# Patient Record
Sex: Male | Born: 1965 | Race: White | Hispanic: No | Marital: Married | Smoking: Former smoker
Health system: Southern US, Community
[De-identification: ages and names within clinical notes are randomized; demographics above are authoritative.]

## PROBLEM LIST (undated history)

## (undated) DIAGNOSIS — M519 Unspecified thoracic, thoracolumbar and lumbosacral intervertebral disc disorder: Secondary | ICD-10-CM

## (undated) DIAGNOSIS — G8929 Other chronic pain: Secondary | ICD-10-CM

## (undated) DIAGNOSIS — F1910 Other psychoactive substance abuse, uncomplicated: Secondary | ICD-10-CM

## (undated) DIAGNOSIS — I10 Essential (primary) hypertension: Secondary | ICD-10-CM

## (undated) DIAGNOSIS — F419 Anxiety disorder, unspecified: Secondary | ICD-10-CM

## (undated) DIAGNOSIS — F1010 Alcohol abuse, uncomplicated: Secondary | ICD-10-CM

## (undated) DIAGNOSIS — D229 Melanocytic nevi, unspecified: Secondary | ICD-10-CM

## (undated) DIAGNOSIS — L409 Psoriasis, unspecified: Secondary | ICD-10-CM

## (undated) HISTORY — DX: Other chronic pain: G89.29

## (undated) HISTORY — DX: Anxiety disorder, unspecified: F41.9

## (undated) HISTORY — PX: SHOULDER SURGERY: SHX246

## (undated) HISTORY — PX: APPENDECTOMY: SHX54

## (undated) HISTORY — DX: Essential (primary) hypertension: I10

## (undated) HISTORY — DX: Unspecified thoracic, thoracolumbar and lumbosacral intervertebral disc disorder: M51.9

## (undated) HISTORY — DX: Psoriasis, unspecified: L40.9

## (undated) HISTORY — DX: Melanocytic nevi, unspecified: D22.9

---

## 2001-05-19 DIAGNOSIS — L409 Psoriasis, unspecified: Secondary | ICD-10-CM

## 2001-05-19 HISTORY — DX: Psoriasis, unspecified: L40.9

## 2010-06-09 ENCOUNTER — Encounter: Payer: Self-pay | Admitting: Internal Medicine

## 2014-11-23 ENCOUNTER — Ambulatory Visit (INDEPENDENT_AMBULATORY_CARE_PROVIDER_SITE_OTHER): Payer: BLUE CROSS/BLUE SHIELD | Admitting: Physician Assistant

## 2014-11-23 ENCOUNTER — Ambulatory Visit (INDEPENDENT_AMBULATORY_CARE_PROVIDER_SITE_OTHER): Payer: BLUE CROSS/BLUE SHIELD

## 2014-11-23 VITALS — BP 140/92 | HR 99 | Temp 98.9°F | Resp 16 | Ht 71.5 in | Wt 201.6 lb

## 2014-11-23 DIAGNOSIS — M25572 Pain in left ankle and joints of left foot: Secondary | ICD-10-CM

## 2014-11-23 LAB — CBC
HEMATOCRIT: 53.2 % — AB (ref 39.0–52.0)
HEMOGLOBIN: 19 g/dL — AB (ref 13.0–17.0)
MCH: 33 pg (ref 26.0–34.0)
MCHC: 35.7 g/dL (ref 30.0–36.0)
MCV: 92.4 fL (ref 78.0–100.0)
MPV: 10.5 fL (ref 8.6–12.4)
Platelets: 228 10*3/uL (ref 150–400)
RBC: 5.76 MIL/uL (ref 4.22–5.81)
RDW: 13.3 % (ref 11.5–15.5)
WBC: 8.4 10*3/uL (ref 4.0–10.5)

## 2014-11-23 LAB — BASIC METABOLIC PANEL
BUN: 12 mg/dL (ref 6–23)
CHLORIDE: 100 meq/L (ref 96–112)
CO2: 24 mEq/L (ref 19–32)
Calcium: 9.6 mg/dL (ref 8.4–10.5)
Creat: 0.89 mg/dL (ref 0.50–1.35)
Glucose, Bld: 93 mg/dL (ref 70–99)
POTASSIUM: 4.2 meq/L (ref 3.5–5.3)
SODIUM: 139 meq/L (ref 135–145)

## 2014-11-23 LAB — URIC ACID: Uric Acid, Serum: 8.5 mg/dL — ABNORMAL HIGH (ref 4.0–7.8)

## 2014-11-23 MED ORDER — MELOXICAM 7.5 MG PO TABS: 7.5000 mg | ORAL_TABLET | Freq: Every day | ORAL | Status: DC

## 2014-11-23 NOTE — Patient Instructions (Addendum)
Your xrays were normal today.  We are checking labs to look for infection and gout. I'll let you know the results of these.  You likely have a calcaneal bursitis causing your symptoms.  Please take the meloxicam once daily for the next 3-4 weeks.  Please avoid shoes that are bothersome, ice the area after activity, and rest and elevate as often as possible.  If you're not improving in one week please let us know.   Bursitis Bursitis is a swelling and soreness (inflammation) of a fluid-filled sac (bursa) that overlies and protects a joint. It can be caused by injury, overuse of the joint, arthritis or infection. The joints most likely to be affected are the elbows, shoulders, hips and knees. HOME CARE INSTRUCTIONS   Apply ice to the affected area for 15-20 minutes each hour while awake for 2 days. Put the ice in a plastic bag and place a towel between the bag of ice and your skin.  Rest the injured joint as much as possible, but continue to put the joint through a full range of motion, 4 times per day. (The shoulder joint especially becomes rapidly "frozen" if not used.) When the pain lessens, begin normal slow movements and usual activities.  Only take over-the-counter or prescription medicines for pain, discomfort or fever as directed by your caregiver.  Your caregiver may recommend draining the bursa and injecting medicine into the bursa. This may help the healing process.  Follow all instructions for follow-up with your caregiver. This includes any orthopedic referrals, physical therapy and rehabilitation. Any delay in obtaining necessary care could result in a delay or failure of the bursitis to heal and chronic pain. SEEK IMMEDIATE MEDICAL CARE IF:   Your pain increases even during treatment.  You develop an oral temperature above 102 F (38.9 C) and have heat and inflammation over the involved bursa. MAKE SURE YOU:   Understand these instructions.  Will watch your  condition.  Will get help right away if you are not doing well or get worse. Document Released: 05/02/2000 Document Revised: 07/28/2011 Document Reviewed: 07/25/2013 [facility] Patient Information 2015 [place], [place]. This information is not intended to replace advice given to you by your health care provider. Make sure you discuss any questions you have with your health care provider.

## 2014-11-23 NOTE — Progress Notes (Signed)
Subjective:    Patient ID: Juan [name], male    DOB: 19-May-1966, 49 y.o.   MRN: [id number]  Chief Complaint  Patient presents with  . Ankle Injury    swollen left ankle x 1 week, no none injury to it   There are no active problems to display for this patient.  Prior to Admission medications   Medication Sig Start Date End Date Taking? Authorizing Provider  ALPRAZolam Duanne Moron) 0.5 MG tablet Take 0.5 mg by mouth at bedtime as needed for anxiety.   Yes Historical Provider, MD  amLODipine (NORVASC) 10 MG tablet Take 10 mg by mouth daily.   Yes Historical Provider, MD  HYDROcodone-acetaminophen (NORCO) 7.5-325 MG per tablet Take 1 tablet by mouth every 6 (six) hours as needed for moderate pain.   Yes Historical Provider, MD  lisinopril (PRINIVIL,ZESTRIL) 40 MG tablet Take 40 mg by mouth daily.   Yes Historical Provider, MD  metoprolol succinate (TOPROL-XL) 25 MG 24 hr tablet Take 25 mg by mouth daily.   Yes Historical Provider, MD  meloxicam (MOBIC) 7.5 MG tablet Take 1 tablet (7.5 mg total) by mouth daily. 11/23/14   Araceli Bouche, PA   Medications, allergies, past medical history, surgical history, family history, social history and problem list reviewed and updated.  HPI  33 yom presents with left ankle/achilles pain.   Sx started one wk ago. Was walking dog noticed mild pain medial ankle, achilles. No trauma. Did not twist ankle. No "pop." Has been able to walk and wear shoes but ongoing mild pain in area past week.   Denies fevers, chills.   Review of Systems See HPI.     Objective:   Physical Exam  Constitutional: He is oriented to person, place, and time. He appears well-developed and well-nourished.  Non-toxic appearance. He does not have a sickly appearance. He does not appear ill. No distress.  BP 140/92 mmHg  Pulse 99  Temp(Src) 98.9 F (37.2 C) (Oral)  Resp 16  Ht 5' 11.5" (1.816 m)  Wt 201 lb 9.6 oz (91.445 kg)  BMI 27.73 kg/m2  SpO2 98%   Musculoskeletal:     Left ankle: He exhibits swelling. Tenderness. Medial malleolus tenderness found. No AITFL, no CF ligament, no posterior TFL, no head of 5th metatarsal and no proximal fibula tenderness found. Achilles tendon exhibits pain. Achilles tendon exhibits no defect and normal Thompson's test results.       Left lower leg: Normal. He exhibits no tenderness, no bony tenderness and no swelling.       Feet:  Mild swelling, warmth over circled area. Moderate ttp over circled area. No decreased rom. No laxity with joint testing. Normal cap refill. Normal sensation. Negative thompsons test. TTP at base of achilles attachment and over medial calcaneous.   Neurological: He is alert and oriented to person, place, and time.   [facility] reading (PRIMARY) by  Dr. Marin Comment. Left ankle/left calcaneal findings: Normal     Assessment & Plan:   Left ankle pain - Plan: DG Ankle Complete Left, DG Os Calcis Left, Basic metabolic panel, Uric Acid, CBC, CANCELED: POCT CBC --normal xrays --await cbc, uric acid, bmp for possible septic arthritis, cellulitis, gout, though doubt septic arthritis with normal rom, able to bear weight, no fevers, chills --suspect retrocalcaneal bursitis  --mobic qd, continue heel lifts, avoid bothersome shoes, rest, elevate, ice --rtc if no improvement one week  Julieta Gutting, PA-C Physician Assistant-Certified [facility]  11/23/2014 2:32 PM

## 2014-11-27 ENCOUNTER — Telehealth: Payer: Self-pay | Admitting: Physician Assistant

## 2014-11-27 NOTE — Telephone Encounter (Signed)
Left message with pt to discuss elevated uric acid.

## 2014-11-27 NOTE — Telephone Encounter (Signed)
Pt returned phone call. Informed him of elevated uric acid. He states his pain has resolved. Instructed to limit alcohol, meat, seafood, and increase exercise level to try to get some weight loss. He is agreeable to this. Informed him he is at risk of gout attacks in the future with this elevated level. Also informed pt of elevated H/H and that we would like to repeat in 3 months. He states he has a pcp who will follow up on this and that he has been told he has an elevated hemoglobin in the past.

## 2015-02-06 DIAGNOSIS — D229 Melanocytic nevi, unspecified: Secondary | ICD-10-CM

## 2015-02-06 HISTORY — DX: Melanocytic nevi, unspecified: D22.9

## 2015-07-16 ENCOUNTER — Ambulatory Visit (INDEPENDENT_AMBULATORY_CARE_PROVIDER_SITE_OTHER): Payer: BLUE CROSS/BLUE SHIELD | Admitting: Family Medicine

## 2015-07-16 ENCOUNTER — Ambulatory Visit (INDEPENDENT_AMBULATORY_CARE_PROVIDER_SITE_OTHER): Payer: BLUE CROSS/BLUE SHIELD

## 2015-07-16 ENCOUNTER — Encounter: Payer: Self-pay | Admitting: Family Medicine

## 2015-07-16 VITALS — BP 114/86 | HR 86 | Temp 98.3°F | Resp 16 | Ht 70.0 in | Wt 194.0 lb

## 2015-07-16 DIAGNOSIS — S42292A Other displaced fracture of upper end of left humerus, initial encounter for closed fracture: Secondary | ICD-10-CM | POA: Diagnosis not present

## 2015-07-16 DIAGNOSIS — S60811A Abrasion of right wrist, initial encounter: Secondary | ICD-10-CM | POA: Diagnosis not present

## 2015-07-16 DIAGNOSIS — M25512 Pain in left shoulder: Secondary | ICD-10-CM

## 2015-07-16 MED ORDER — HYDROCODONE-ACETAMINOPHEN 10-325 MG PO TABS: 1.0000 | ORAL_TABLET | Freq: Four times a day (QID) | ORAL | Status: DC | PRN

## 2015-07-16 NOTE — Patient Instructions (Addendum)
Because you received an x-ray today, you will receive an invoice from [facility]. Please contact [facility] at [phone] with questions or concerns regarding your invoice. Our billing staff will not be able to assist you with those questions. Shoulder Fracture (Proximal Humerus or Glenoid) A shoulder fracture is a broken upper arm bone or a broken socket bone. The humerus is the upper arm bone and the glenoid is the shoulder socket. Proximal means the humerus is broken near the shoulder. Most of the time the bones of a broken shoulder are in an acceptable position. Usually, the injury can be treated with a shoulder immobilizer or sling and swath bandage. These devices support the arm and prevent any shoulder movement. If the bones are not in a good position, then surgery is sometimes needed. Shoulder fractures usually initially cause swelling, pain, and discoloration around the upper arm. They heal in 8 to 12 weeks with proper treatment. SYMPTOMS  At the time of injury:  Pain.  Tenderness.  Regular body contours are not normal. Later symptoms may include:  Swelling and bruising of the elbow and hand.  Swelling and bruising of the arm or chest. Other symptoms include:  Pain when lifting or turning the arm.  Paralysis below the fracture.  Numbness or coldness below the fracture. CAUSES   Indirect force from falling on an outstretched arm.  A blow to the shoulder. RISK INCREASES WITH:  Not being in shape.  Playing contact sports, such as football, soccer, hockey, or rugby.  Sports where falling on an outstretched arm occurs, such as basketball, skateboarding, or volleyball.  History of bone or joint disease.  History of shoulder injury. PREVENTION  Warm up before activity.  Stretch before activity.  Stay in shape with your:  Heart fitness.  Flexibility.  Shoulder Strength.  Falling with the proper technique. PROGNOSIS  In adults, healing time  is about 7 weeks. For children, healing time is about 5 weeks. Surgery may be needed. RELATED COMPLICATIONS  The bones do not heal together (nonunion).  The bones do not align properly when they heal (malunion).  Long-term problems with pain, stiffness, swelling, or loss of motion.  The injured arm heals shorter than the other.  Nerves are injured in the arm.  Arthritis in the shoulder.  Normal bone growth is interrupted in children.  Blood supply to the shoulder joint is diminished. TREATMENT If the bones are aligned, then initial treatment will be with ice and medicine to help with pain. The shoulder will be held in place with a sling (immobilization). The shoulder will be allowed to heal for up to 6 weeks. Injuries that may need surgery include:  Severe fractures.  Fractures that are not in appropriate alignment (displaced).  Non-displaced fractures (not common). Surgery helps the bones align correctly. The bones may be held in place with:  Sutures.  Wires.  Rods.  Plates.  Screws.  Pins. If you have had surgery or not, you will likely be assisted by a physical therapist or athletic trainer to get the best results with your injured shoulder. This will likely include exercises to strengthen and stretch the injured and surrounding areas. MEDICATION  If pain medicine is needed, nonsteroidal anti-inflammatory medicines (such as aspirin or ibuprofen) or other minor pain relievers (such as acetaminophen) are often advised.  Do not take pain medicine for 7 days before surgery.  Stronger pain relievers may be prescribed. Use only as directed and take only as much as you need. COLD THERAPY  Cold treatment (icing) relieves pain and reduces inflammation. Cold treatment should be applied for 10 to 15 minutes every 2 to 3 hours, and immediately after activity that aggravates your symptoms. Use ice packs or an ice massage. SEEK IMMEDIATE MEDICAL CARE IF:  You have severe  shoulder pain unrelieved by rest and taking pain medicine.  You have pain, numbness, tingling, or weakness in the hand or wrist.  You have shortness of breath, chest pain, severe weakness, or fainting.  You have severe pain with motion of the fingers or wrist.  Blue, gray, or dark color appears in the fingernails on injured extremity.   This information is not intended to replace advice given to you by your health care provider. Make sure you discuss any questions you have with your health care provider.   Document Released: 05/05/2005 Document Revised: 07/28/2011 Document Reviewed: 08/17/2008 Elsevier Interactive Patient Education [employer].

## 2015-07-16 NOTE — Progress Notes (Signed)
Subjective:    Patient ID: Juan [name], male    DOB: [birth year], 50 y.o.   MRN: [id number]  07/16/2015  Shoulder Pain   HPIexiting attack area and ladder slipped and went through celing; hyper-extended L shoulder; occurred at 1:00pm.  Really painful.  Heard a pop.  Did not initially hurt but then stiffened up immediately.  No neck pain. No head trauma.  No n/t/w.  R arm bleeding; went through. Tetanus UTD in unsure date during recent CPE.  No swelling in L arm; L shoulder feels puffy.  PCP: Burton Apley, MD  Review of Systems  Constitutional: Negative for fever, chills, diaphoresis and fatigue.  Musculoskeletal: Positive for joint swelling and arthralgias.  Skin: Positive for wound. Negative for color change, pallor and rash.  Neurological: Positive for weakness. Negative for numbness.          Objective:    BP 114/86 mmHg  Pulse 86  Temp(Src) 98.3 F (36.8 C) (Oral)  Resp 16  Ht 5\' 10"  (1.778 m)  Wt 194 lb (87.998 kg)  BMI 27.84 kg/m2  SpO2 98% Physical Exam  Constitutional: He appears well-developed and well-nourished. No distress.  Cardiovascular: Normal rate, regular rhythm, normal heart sounds and intact distal pulses.   No murmur heard. Pulmonary/Chest: Effort normal and breath sounds normal. No respiratory distress. He has no wheezes. He has no rales.  Musculoskeletal:       Left shoulder: He exhibits decreased range of motion, tenderness, bony tenderness, pain and decreased strength. He exhibits no swelling, no spasm and normal pulse.       Left elbow: Normal. He exhibits normal range of motion, no swelling and no effusion. No tenderness found. No radial head, no medial epicondyle, no lateral epicondyle and no olecranon process tenderness noted.       Left wrist: Normal. He exhibits normal range of motion, no tenderness, no bony tenderness and no effusion.       Cervical back: Normal. He exhibits normal range of motion, no tenderness, no bony tenderness and  no swelling.       Left upper arm: He exhibits tenderness and bony tenderness. He exhibits no swelling, no edema, no deformity and no laceration.  L SHOULDER: pain with elevation above 45 degrees.  Skin: Skin is warm and dry. He is not diaphoretic.  R wrist with two superficial abrasions with good hemostasis.         Assessment & Plan:   1. Humeral head fracture, left, closed, initial encounter   2. Left shoulder pain   3. Abrasion of right wrist, initial encounter     Orders Placed This Encounter  Procedures  . DG Shoulder Left    Standing Status: Future     Number of Occurrences: 1     Standing Expiration Date: 07/15/2016    Order Specific Question:  Reason for Exam (SYMPTOM  OR DIAGNOSIS REQUIRED)    Answer:  L deltoid pain after hyper-extension injury/trauma    Order Specific Question:  Preferred imaging location?    Answer:  External  . DG Humerus Left    Standing Status: Future     Number of Occurrences: 1     Standing Expiration Date: 07/15/2016    Order Specific Question:  Reason for Exam (SYMPTOM  OR DIAGNOSIS REQUIRED)    Answer:  L humerus pain/L deltoid pain; hyper-extension injury    Order Specific Question:  Preferred imaging location?    Answer:  External  . Ambulatory referral to  Orthopedic Surgery    Referral Priority:  Urgent    Referral Type:  Surgical    Referral Reason:  Specialty Services Required    Requested Specialty:  Orthopedic Surgery    Number of Visits Requested:  1  . Sling immobilizer    LEFT SHOULDER/ARM   Meds ordered this encounter  Medications  . HYDROcodone-acetaminophen (NORCO) 10-325 MG tablet    Sig: Take 1 tablet by mouth every 6 (six) hours as needed.    Dispense:  30 tablet    Refill:  0    No Follow-up on file.    Husam Hohn Paulita Fujita, M.D. [facility] [address] [place], [place]  [address] [phone] phone [phone] fax

## 2015-07-25 ENCOUNTER — Ambulatory Visit: Admit: 2015-07-25 | Payer: Self-pay | Admitting: Orthopedic Surgery

## 2015-07-25 SURGERY — OPEN REDUCTION INTERNAL FIXATION (ORIF) SHOULDER FRACTURE
Anesthesia: Choice | Site: Shoulder | Laterality: Left

## 2019-02-25 ENCOUNTER — Other Ambulatory Visit: Payer: Self-pay | Admitting: Internal Medicine

## 2019-02-25 ENCOUNTER — Ambulatory Visit
Admission: RE | Admit: 2019-02-25 | Discharge: 2019-02-25 | Disposition: A | Discharge status: Home / Self Care | Payer: BLUE CROSS/BLUE SHIELD | Source: Ambulatory Visit | Attending: Internal Medicine | Admitting: Internal Medicine

## 2019-02-25 DIAGNOSIS — M25561 Pain in right knee: Secondary | ICD-10-CM

## 2019-08-01 ENCOUNTER — Telehealth: Payer: Self-pay

## 2019-08-01 MED ORDER — OTEZLA 30 MG PO TABS: 30.0000 mg | ORAL_TABLET | Freq: Two times a day (BID) | ORAL | 0 refills | Status: DC

## 2019-08-01 NOTE — Telephone Encounter (Signed)
Patient needs office visit for further refills

## 2019-08-04 ENCOUNTER — Other Ambulatory Visit: Payer: Self-pay | Admitting: Physician Assistant

## 2019-08-05 ENCOUNTER — Other Ambulatory Visit: Payer: Self-pay | Admitting: *Deleted

## 2019-08-05 MED ORDER — ENSTILAR 0.005-0.064 % EX FOAM: 1.0000 "application " | CUTANEOUS | 6 refills | Status: AC

## 2019-08-08 ENCOUNTER — Other Ambulatory Visit: Payer: Self-pay | Admitting: *Deleted

## 2019-08-08 MED ORDER — ENSTILAR 0.005-0.064 % EX FOAM: 1.0000 "application " | Freq: Once | CUTANEOUS | 3 refills | Status: AC

## 2019-08-17 ENCOUNTER — Encounter: Payer: Self-pay | Admitting: Cardiology

## 2019-09-07 NOTE — Progress Notes (Deleted)
Cardiology Office Note   Date:  09/07/2019   ID:  Juan, [name] 11/12/1965, MRN [id number]  PCP:  Burton Apley, MD  Cardiologist:   Katriona Schmierer [place], MD   No chief complaint on file.     History of Present Illness: Juan [name] is a 54 y.o. male who is seen at the request of Dr Su Hilt for evaluation of new onset Atrial fibrillation. He has a history of HTN.    Past Medical History:  Diagnosis Date  . Atypical nevus 02/06/2015   Right Back - Mild  . Atypical nevus 04/30/2016   Right Chest - Mild  . Atypical nevus 04/30/2016   Right Shoulder - Mild  . Hypertension   . Psoriasis     Past Surgical History:  Procedure Laterality Date  . APPENDECTOMY       Current Outpatient Medications  Medication Sig Dispense Refill  . ALPRAZolam (XANAX) 0.5 MG tablet Take 0.5 mg by mouth at bedtime as needed for anxiety.    Marland Kitchen amLODipine (NORVASC) 10 MG tablet Take 10 mg by mouth daily.    Marland Kitchen Apremilast (OTEZLA) 30 MG TABS Take 1 tablet (30 mg total) by mouth 2 (two) times daily. 60 tablet 0  . clobetasol (OLUX) 0.05 % topical foam APPLY TO SCALP DAILY AS NEEDED 100 g 6  . HYDROcodone-acetaminophen (NORCO) 10-325 MG tablet Take 1 tablet by mouth every 6 (six) hours as needed. 30 tablet 0  . HYDROcodone-acetaminophen (NORCO) 7.5-325 MG per tablet Take 1 tablet by mouth every 6 (six) hours as needed for moderate pain.    Marland Kitchen lisinopril (PRINIVIL,ZESTRIL) 40 MG tablet Take 40 mg by mouth daily.    . meloxicam (MOBIC) 7.5 MG tablet Take 1 tablet (7.5 mg total) by mouth daily. 30 tablet 0  . metoprolol succinate (TOPROL-XL) 25 MG 24 hr tablet Take 25 mg by mouth daily.     No current facility-administered medications for this visit.    Allergies:   Patient has no known allergies.    Social History:  The patient  reports that he has never smoked. He does not have any smokeless tobacco history on file. He reports current alcohol use. He reports that he does not use drugs.    Family History:  The patient's ***family history includes Cancer in his mother; Hypertension in his sister.    ROS:  Please see the history of present illness.   Otherwise, review of systems are positive for {NONE DEFAULTED:18576::"none"}.   All other systems are reviewed and negative.    PHYSICAL EXAM: VS:  There were no vitals taken for this visit. , BMI There is no height or weight on file to calculate BMI. GEN: Well nourished, well developed, in no acute distress  HEENT: normal  Neck: no JVD, carotid bruits, or masses Cardiac: ***RRR; no murmurs, rubs, or gallops,no edema  Respiratory:  clear to auscultation bilaterally, normal work of breathing GI: soft, nontender, nondistended, + BS MS: no deformity or atrophy  Skin: warm and dry, no rash Neuro:  Strength and sensation are intact Psych: euthymic mood, full affect   EKG:  EKG {ACTION; IS/IS [id number]:[id number]} ordered today. The ekg ordered today demonstrates ***   Recent Labs: No results found for requested labs within last 8760 hours.    Lipid Panel No results found for: CHOL, TRIG, HDL, CHOLHDL, VLDL, LDLCALC, LDLDIRECT    Wt Readings from Last 3 Encounters:  07/16/15 194 lb (88 kg)  11/23/14 201 lb 9.6 oz (  91.4 kg)      Other studies Reviewed: Additional studies/ records that were reviewed today include: ***. Review of the above records demonstrates: ***   ASSESSMENT AND PLAN:  1.  ***   Current medicines are reviewed at length with the patient today.  The patient {ACTIONS; HAS/DOES NOT HAVE:19233} concerns regarding medicines.  The following changes have been made:  {PLAN; NO CHANGE:13088:s}  Labs/ tests ordered today include: *** No orders of the defined types were placed in this encounter.    Disposition:   FU with *** in {gen number 1-61:096045} {Days to years:10300}  Signed, Future Yeldell [place], MD  09/07/2019 7:12 AM    [facility] [address], [place], [place],  [address] Phone [phone], Fax [phone]

## 2019-09-15 ENCOUNTER — Ambulatory Visit: Payer: BLUE CROSS/BLUE SHIELD | Admitting: Cardiology

## 2019-09-15 NOTE — Progress Notes (Signed)
Cardiology Office Note   Date:  09/19/2019   ID:  Juan [name], DOB [birth year], MRN [id number]  PCP:  Burton Apley, MD  Cardiologist:   Cartina Brousseau [place], MD   Chief Complaint  Patient presents with  . Atrial Fibrillation      History of Present Illness: Juan [name] "Juan [name]" is a 54 y.o. male who is seen at the request of Dr Su Hilt for evaluation of new onset Atrial fibrillation. He has a history of HTN.  He has yearly physicals. This year he was noted to be in Afib with controlled rate on Toprol XL. He has minimal palpitations. No dyspnea or chest pain. No history of TIA or Stroke. Does drink 3-6 beers per day. Wife reports history of loud snoring and apneic spells. No other cardiac disease.     Past Medical History:  Diagnosis Date  . Anxiety   . Atypical nevus 02/06/2015   Right Back - Mild  . Atypical nevus 04/30/2016   Right Chest - Mild  . Atypical nevus 04/30/2016   Right Shoulder - Mild  . Chronic back pain   . Hypertension   . Lumbar disc disease   . Psoriasis     Past Surgical History:  Procedure Laterality Date  . APPENDECTOMY    . SHOULDER SURGERY Left      Current Outpatient Medications  Medication Sig Dispense Refill  . ALPRAZolam (XANAX) 1 MG tablet Take 1 mg by mouth at bedtime as needed.    Marland Kitchen amLODipine (NORVASC) 5 MG tablet Take 5 mg by mouth daily.    Marland Kitchen Apremilast (OTEZLA) 30 MG TABS Take 1 tablet (30 mg total) by mouth 2 (two) times daily. 60 tablet 0  . ciprofloxacin (CIPRO) 500 MG tablet Take 500 mg by mouth 2 (two) times daily.    . clobetasol (OLUX) 0.05 % topical foam APPLY TO SCALP DAILY AS NEEDED 100 g 6  . ENSTILAR 0.005-0.064 % FOAM APPLY TOPICALLY TO THE AFFECTED AREA 1 TIME FOR 1 DOSE.    Marland Kitchen HYDROcodone-acetaminophen (NORCO) 10-325 MG tablet Take 1 tablet by mouth every 6 (six) hours as needed. 30 tablet 0  . HYDROcodone-acetaminophen (NORCO) 7.5-325 MG per tablet Take 1 tablet by mouth every 6 (six) hours as needed for  moderate pain.    Marland Kitchen lisinopril (PRINIVIL,ZESTRIL) 40 MG tablet Take 40 mg by mouth daily.    . meloxicam (MOBIC) 7.5 MG tablet Take 1 tablet (7.5 mg total) by mouth daily. 30 tablet 0  . metoprolol succinate (TOPROL-XL) 25 MG 24 hr tablet Take 25 mg by mouth daily.    . sildenafil (REVATIO) 20 MG tablet Take 40 mg by mouth daily.    . valACYclovir (VALTREX) 1000 MG tablet Take 1,000 mg by mouth 2 (two) times daily.    . rivaroxaban (XARELTO) 20 MG TABS tablet Take 1 tablet (20 mg total) by mouth daily with supper. 30 tablet 6   No current facility-administered medications for this visit.    Allergies:   Patient has no known allergies.    Social History:  The patient  reports that he has never smoked. He has never used smokeless tobacco. He reports current alcohol use. He reports that he does not use drugs.   Family History:  The patient's family history includes Cancer in his mother; Heart attack (age of onset: 50) in his mother; Heart disease in his mother; Hypertension in his sister.    ROS:  Please see the history of present illness.  Otherwise, review of systems are positive for none.   All other systems are reviewed and negative.    PHYSICAL EXAM: VS:  BP 129/87   Pulse 89   Ht 5\' 11"  (1.803 m)   Wt 167 lb 6.4 oz (75.9 kg)   SpO2 99%   BMI 23.35 kg/m  , BMI Body mass index is 23.35 kg/m. GEN: Well nourished, well developed, in no acute distress  HEENT: normal  Neck: no JVD, carotid bruits, or masses Cardiac: IRRR; no murmurs, rubs, or gallops,no edema  Respiratory:  clear to auscultation bilaterally, normal work of breathing GI: soft, nontender, nondistended, + BS MS: no deformity or atrophy  Skin: warm and dry, no rash Neuro:  Strength and sensation are intact Psych: euthymic mood, full affect   EKG:  EKG is ordered today. The ekg ordered today demonstrates Afib rate 97. Mild nonspecific ST abnormality. I have personally reviewed and interpreted this study.     Recent Labs: No results found for requested labs within last 8760 hours.    Lipid Panel No results found for: CHOL, TRIG, HDL, CHOLHDL, VLDL, LDLCALC, LDLDIRECT    Wt Readings from Last 3 Encounters:  09/19/19 167 lb 6.4 oz (75.9 kg)  07/16/15 194 lb (88 kg)  11/23/14 201 lb 9.6 oz (91.4 kg)      Other studies Reviewed: Additional studies/ records that were reviewed today include: . Review of the above records demonstrates: none   ASSESSMENT AND PLAN:  1.  Atrial fibrillation. Rate controlled on Toprol. Minimally symptomatic. [place] vasc score of 1. Potential triggers include HTN, Etoh use, and sleep apnea. Will ask for copy of recent lab work. Schedule for Echo and sleep study. Recommend reduction in Etoh use to < 2 drinks/day. Start Xarelto 20 mg daily. Follow up in 4 weeks to consider for possible cardioversion 2. HTN controlled 3. Disordered sleep with snoring and  Apneic spells. Arrange sleep study.   Current medicines are reviewed at length with the patient today.  The patient does not have concerns regarding medicines.  The following changes have been made:  Add Xarelto 20 mg daily  Labs/ tests ordered today include:   Orders Placed This Encounter  Procedures  . EKG 12-Lead  . ECHOCARDIOGRAM COMPLETE  . Split night study     Disposition:   FU with me in 4 weeks  Signed, Kamber Vignola [place], MD  09/19/2019 3:43 PM    [facility] [place] [facility] [address] [place], [place], [address] Phone [phone], Fax [phone]

## 2019-09-19 ENCOUNTER — Other Ambulatory Visit: Payer: Self-pay

## 2019-09-19 ENCOUNTER — Telehealth: Payer: Self-pay | Admitting: *Deleted

## 2019-09-19 ENCOUNTER — Encounter: Payer: Self-pay | Admitting: Cardiology

## 2019-09-19 ENCOUNTER — Ambulatory Visit (INDEPENDENT_AMBULATORY_CARE_PROVIDER_SITE_OTHER): Payer: BC Managed Care – PPO | Admitting: Cardiology

## 2019-09-19 VITALS — BP 129/87 | HR 89 | Ht 71.0 in | Wt 167.4 lb

## 2019-09-19 DIAGNOSIS — I10 Essential (primary) hypertension: Secondary | ICD-10-CM | POA: Diagnosis not present

## 2019-09-19 DIAGNOSIS — R0683 Snoring: Secondary | ICD-10-CM

## 2019-09-19 DIAGNOSIS — I4819 Other persistent atrial fibrillation: Secondary | ICD-10-CM | POA: Diagnosis not present

## 2019-09-19 MED ORDER — RIVAROXABAN 20 MG PO TABS: 20.0000 mg | ORAL_TABLET | Freq: Every day | ORAL | 6 refills | Status: DC

## 2019-09-19 NOTE — Patient Instructions (Signed)
Start Xarelto 20 mg daily for blood thinner  Reduce alcohol intake to less than 2 a day  We will arrange for an Echocardiogram and a sleep study.

## 2019-09-19 NOTE — Telephone Encounter (Signed)
-----   Message from Roland Earl sent at 09/19/2019  3:51 PM EDT ----- Regarding: Sleep Study Dr. Martinique

## 2019-10-05 ENCOUNTER — Other Ambulatory Visit: Payer: Self-pay

## 2019-10-05 ENCOUNTER — Ambulatory Visit (HOSPITAL_COMMUNITY): Payer: BC Managed Care – PPO | Attending: Cardiology

## 2019-10-05 DIAGNOSIS — R0683 Snoring: Secondary | ICD-10-CM | POA: Diagnosis present

## 2019-10-05 DIAGNOSIS — I4819 Other persistent atrial fibrillation: Secondary | ICD-10-CM

## 2019-10-05 DIAGNOSIS — I10 Essential (primary) hypertension: Secondary | ICD-10-CM

## 2019-10-10 ENCOUNTER — Other Ambulatory Visit: Payer: Self-pay

## 2019-10-19 ENCOUNTER — Telehealth: Payer: Self-pay | Admitting: Physician Assistant

## 2019-10-19 NOTE — Telephone Encounter (Signed)
Phone call to [facility] to inform them that the patient needs to contact the office for a office visit before any refills can be given.

## 2019-10-19 NOTE — Telephone Encounter (Signed)
[facility] calling to check status of refill request on otezla 30 mg tablets that was faxed to [facility] office. [id number]

## 2019-10-25 ENCOUNTER — Telehealth: Payer: Self-pay | Admitting: *Deleted

## 2019-10-25 NOTE — Telephone Encounter (Signed)
Left message to call the sleep lab for sleep study appointment details.contact information provided..  (could not leave my #. Working from home. Not connected to [employer].)

## 2019-11-05 ENCOUNTER — Other Ambulatory Visit (HOSPITAL_COMMUNITY)
Admission: RE | Admit: 2019-11-05 | Discharge: 2019-11-05 | Disposition: A | Discharge status: Home / Self Care | Payer: BC Managed Care – PPO | Source: Ambulatory Visit | Attending: Cardiovascular Disease | Admitting: Cardiovascular Disease

## 2019-11-05 DIAGNOSIS — Z01812 Encounter for preprocedural laboratory examination: Secondary | ICD-10-CM | POA: Insufficient documentation

## 2019-11-05 DIAGNOSIS — Z20822 Contact with and (suspected) exposure to covid-19: Secondary | ICD-10-CM | POA: Insufficient documentation

## 2019-11-05 LAB — SARS CORONAVIRUS 2 (TAT 6-24 HRS): SARS Coronavirus 2: NEGATIVE

## 2019-11-07 ENCOUNTER — Ambulatory Visit (HOSPITAL_BASED_OUTPATIENT_CLINIC_OR_DEPARTMENT_OTHER): Payer: BC Managed Care – PPO | Admitting: Cardiovascular Disease

## 2019-11-11 ENCOUNTER — Ambulatory Visit (HOSPITAL_BASED_OUTPATIENT_CLINIC_OR_DEPARTMENT_OTHER): Payer: BC Managed Care – PPO | Attending: Cardiology | Admitting: Cardiovascular Disease

## 2019-11-11 DIAGNOSIS — G4730 Sleep apnea, unspecified: Secondary | ICD-10-CM | POA: Insufficient documentation

## 2019-11-11 DIAGNOSIS — G4733 Obstructive sleep apnea (adult) (pediatric): Secondary | ICD-10-CM

## 2019-11-11 DIAGNOSIS — I10 Essential (primary) hypertension: Secondary | ICD-10-CM | POA: Diagnosis not present

## 2019-11-11 DIAGNOSIS — I4819 Other persistent atrial fibrillation: Secondary | ICD-10-CM | POA: Diagnosis not present

## 2019-11-11 DIAGNOSIS — R0683 Snoring: Secondary | ICD-10-CM | POA: Diagnosis not present

## 2019-11-11 NOTE — Progress Notes (Deleted)
Cardiology Office Note   Date:  11/11/2019   ID:  Corvin, Bogus 11-26-1965, MRN [id number]  PCP:  Burton Apley, MD  Cardiologist:   Suad Autrey [place], MD   No chief complaint on file.     History of Present Illness: Cynthia K Wesche "Juan [name]" is a 54 y.o. male who is seen at the request of Dr Su Hilt for evaluation of new onset Atrial fibrillation. He has a history of HTN.  He has yearly physicals. This year he was noted to be in Afib with controlled rate on Toprol XL. He has minimal palpitations. No dyspnea or chest pain. No history of TIA or Stroke. Does drink 3-6 beers per day. Wife reports history of loud snoring and apneic spells. No other cardiac disease. He was started on Xarelto. Recommended reduction in Etoh use. Echo showed moderate LVH otherwise normal. Sleep study is pending.    Past Medical History:  Diagnosis Date  . Anxiety   . Atypical nevus 02/06/2015   Right Back - Mild  . Atypical nevus 04/30/2016   Right Chest - Mild  . Atypical nevus 04/30/2016   Right Shoulder - Mild  . Chronic back pain   . Hypertension   . Lumbar disc disease   . Psoriasis     Past Surgical History:  Procedure Laterality Date  . APPENDECTOMY    . SHOULDER SURGERY Left      Current Outpatient Medications  Medication Sig Dispense Refill  . ALPRAZolam (XANAX) 1 MG tablet Take 1 mg by mouth at bedtime as needed.    Marland Kitchen amLODipine (NORVASC) 5 MG tablet Take 5 mg by mouth daily.    Marland Kitchen Apremilast (OTEZLA) 30 MG TABS Take 1 tablet (30 mg total) by mouth 2 (two) times daily. 60 tablet 0  . ciprofloxacin (CIPRO) 500 MG tablet Take 500 mg by mouth 2 (two) times daily.    . clobetasol (OLUX) 0.05 % topical foam APPLY TO SCALP DAILY AS NEEDED 100 g 6  . ENSTILAR 0.005-0.064 % FOAM APPLY TOPICALLY TO THE AFFECTED AREA 1 TIME FOR 1 DOSE.    Marland Kitchen HYDROcodone-acetaminophen (NORCO) 10-325 MG tablet Take 1 tablet by mouth every 6 (six) hours as needed. 30 tablet 0  . HYDROcodone-acetaminophen  (NORCO) 7.5-325 MG per tablet Take 1 tablet by mouth every 6 (six) hours as needed for moderate pain.    Marland Kitchen lisinopril (PRINIVIL,ZESTRIL) 40 MG tablet Take 40 mg by mouth daily.    . meloxicam (MOBIC) 7.5 MG tablet Take 1 tablet (7.5 mg total) by mouth daily. 30 tablet 0  . metoprolol succinate (TOPROL-XL) 25 MG 24 hr tablet Take 25 mg by mouth daily.    . rivaroxaban (XARELTO) 20 MG TABS tablet Take 1 tablet (20 mg total) by mouth daily with supper. 30 tablet 6  . sildenafil (REVATIO) 20 MG tablet Take 40 mg by mouth daily.    . valACYclovir (VALTREX) 1000 MG tablet Take 1,000 mg by mouth 2 (two) times daily.     No current facility-administered medications for this visit.    Allergies:   Patient has no known allergies.    Social History:  The patient  reports that he has never smoked. He has never used smokeless tobacco. He reports current alcohol use. He reports that he does not use drugs.   Family History:  The patient's family history includes Cancer in his mother; Heart attack (age of onset: 66) in his mother; Heart disease in his mother; Hypertension in his  sister.    ROS:  Please see the history of present illness.   Otherwise, review of systems are positive for none.   All other systems are reviewed and negative.    PHYSICAL EXAM: VS:  There were no vitals taken for this visit. , BMI There is no height or weight on file to calculate BMI. GEN: Well nourished, well developed, in no acute distress  HEENT: normal  Neck: no JVD, carotid bruits, or masses Cardiac: IRRR; no murmurs, rubs, or gallops,no edema  Respiratory:  clear to auscultation bilaterally, normal work of breathing GI: soft, nontender, nondistended, + BS MS: no deformity or atrophy  Skin: warm and dry, no rash Neuro:  Strength and sensation are intact Psych: euthymic mood, full affect   EKG:  EKG is ordered today. The ekg ordered today demonstrates Afib rate 97. Mild nonspecific ST abnormality. I have personally  reviewed and interpreted this study.    Recent Labs: No results found for requested labs within last 8760 hours.    Lipid Panel No results found for: CHOL, TRIG, HDL, CHOLHDL, VLDL, LDLCALC, LDLDIRECT   Dated 08/17/19: Hgb 20.2, Hct 56.8. otherwise CBC normal. Total bili 1.7. alk phos 137. Otherwise CMET normal. TSH normal. Cholesterol 151, triglycerides 64, HDL 85, LDL 53.    Wt Readings from Last 3 Encounters:  09/19/19 167 lb 6.4 oz (75.9 kg)  07/16/15 194 lb (88 kg)  11/23/14 201 lb 9.6 oz (91.4 kg)      Other studies Reviewed: Additional studies/ records that were reviewed today include: .  Echo 10/05/19: IMPRESSIONS    1. Left ventricular ejection fraction, by estimation, is 55 to 60%. The  left ventricle has normal function. The left ventricle has no regional  wall motion abnormalities. There is moderate concentric left ventricular  hypertrophy. Left ventricular  diastolic parameters are consistent with Grade I diastolic dysfunction  (impaired relaxation).  2. Right ventricular systolic function is normal. The right ventricular  size is normal.  3. The mitral valve is normal in structure. No evidence of mitral valve  regurgitation. No evidence of mitral stenosis.  4. The aortic valve is tricuspid. Aortic valve regurgitation is trivial.  No aortic stenosis is present.  5. Aortic dilatation noted. There is mild dilatation at the level of the  sinuses of Valsalva measuring 38 mm.  6. The inferior vena cava is normal in size with greater than 50%  respiratory variability, suggesting right atrial pressure of 3 mmHg.    ASSESSMENT AND PLAN:  1.  Atrial fibrillation. Rate controlled on Toprol. Minimally symptomatic. [place] vasc score of 1. Potential triggers include HTN, Etoh use, and sleep apnea. Will ask for copy of recent lab work. Schedule for Echo and sleep study. Recommend reduction in Etoh use to < 2 drinks/day. Start Xarelto 20 mg daily. Follow up in 4 weeks  to consider for possible cardioversion 2. HTN controlled 3. Disordered sleep with snoring and  Apneic spells. Arrange sleep study.   Current medicines are reviewed at length with the patient today.  The patient does not have concerns regarding medicines.  The following changes have been made:  Add Xarelto 20 mg daily  Labs/ tests ordered today include:   No orders of the defined types were placed in this encounter.    Disposition:   FU with me in 4 weeks  Signed, Kori Colin [place], MD  11/11/2019 8:25 AM    [facility] [address] [place], [place], [address] Phone [phone], Fax  [phone]

## 2019-11-14 ENCOUNTER — Ambulatory Visit: Payer: BLUE CROSS/BLUE SHIELD | Admitting: Cardiology

## 2019-11-14 ENCOUNTER — Ambulatory Visit: Payer: BC Managed Care – PPO | Admitting: Cardiology

## 2019-11-16 ENCOUNTER — Encounter (HOSPITAL_BASED_OUTPATIENT_CLINIC_OR_DEPARTMENT_OTHER): Payer: Self-pay | Admitting: Cardiovascular Disease

## 2019-11-16 NOTE — Procedures (Signed)
Patient Name: Juan [name], Juan [name] Date: 11/11/2019 Gender: Male D.O.B: 06/03/1965 Age (years): 54 Referring Provider: Peter M Martinique Height (inches): 71 Interpreting Physician: Shelva Majestic MD, ABSM Weight (lbs): 165 RPSGT: Baxter Flattery BMI: 23 MRN: [id number] Neck Size: 16.00  CLINICAL INFORMATION Sleep Study Type: NPSG  Indication for sleep study: Hypertension, Snoring, Witnesses Apnea / Gasping During Sleep  Epworth Sleepiness Score: 18  SLEEP STUDY TECHNIQUE As per the AASM Manual for the Scoring of Sleep and Associated Events v2.3 (April 2016) with a hypopnea requiring 4% desaturations.  The channels recorded and monitored were frontal, central and occipital EEG, electrooculogram (EOG), submentalis EMG (chin), nasal and oral airflow, thoracic and abdominal wall motion, anterior tibialis EMG, snore microphone, electrocardiogram, and pulse oximetry.  MEDICATIONS ALPRAZolam (XANAX) 1 MG tablet amLODipine (NORVASC) 5 MG tablet Apremilast (OTEZLA) 30 MG TABS ciprofloxacin (CIPRO) 500 MG tablet clobetasol (OLUX) 0.05 % topical foam ENSTILAR 0.005-0.064 % FOAM HYDROcodone-acetaminophen (NORCO) 10-325 MG tablet HYDROcodone-acetaminophen (NORCO) 7.5-325 MG per tablet lisinopril (PRINIVIL,ZESTRIL) 40 MG tablet meloxicam (MOBIC) 7.5 MG tablet metoprolol succinate (TOPROL-XL) 25 MG 24 hr tablet rivaroxaban (XARELTO) 20 MG TABS tablet sildenafil (REVATIO) 20 MG tablet valACYclovir (VALTREX) 1000 MG tablet  Medications self-administered by patient taken the night of the study : N/A  SLEEP ARCHITECTURE The study was initiated at 10:09:52 PM and ended at 4:56:51 AM.  Sleep onset time was 8.2 minutes and the sleep efficiency was 82.9%%. The total sleep time was 337.3 minutes.  Stage REM latency was 10.5 minutes.  The patient spent 5.0%% of the night in stage N1 sleep, 70.6%% in stage N2 sleep, 0.0%% in stage N3 and 24.3% in REM.  Alpha intrusion was  absent.  Supine sleep was 64.27%.  RESPIRATORY PARAMETERS The overall apnea/hypopnea index (AHI) was 15.1 per hour. The respiratory index (RDI) was 22.8/h.There were 55 total apneas, including 54 obstructive, 1 central and 0 mixed apneas. There were 30 hypopneas and 43 RERAs.  The AHI during Stage REM sleep was 13.9 per hour.  AHI while supine was 23.0 per hour.  The mean oxygen saturation was 95.8%. The minimum SpO2 during sleep was 89.0%.  Moderate snoring was noted during this study.  CARDIAC DATA The 2 lead EKG demonstrated sinus rhythm. The mean heart rate was 71.7 beats per minute. Other EKG findings include: Atrial Fibrillation.  LEG MOVEMENT DATA The total PLMS were 0 with a resulting PLMS index of 0.0. Associated arousal with leg movement index was 0.0 .  IMPRESSIONS - Moderate obstructive sleep apnea occurred during this study (AHI 15.1/h; RDI 22.8/h). - No significant central sleep apnea occurred during this study (CAI = 0.2/h). - The patient had minimal  oxygen desaturation to a nadir of 89.0% during REM sleep. - The patient snored with moderate snoring volume. - EKG findings include Atrial Fibrillation. - Clinically significant periodic limb movements did not occur during sleep. No significant associated arousals.  DIAGNOSIS - Obstructive Sleep Apnea (327.23 [G47.33 ICD-10]) - Nocturnal Hypoxemia (327.26 [G47.36 ICD-10])  RECOMMENDATIONS - Therapeutic CPAP titration to determine optimal pressure required to alleviate sleep disordered breathing. - Effort should be made tooptimize nasal and oropharyngeal patency. - Positional therapy avoiding supine position during sleep. - Avoid alcohol, sedatives and other CNS depressants that may worsen sleep apnea and disrupt normal sleep architecture. - Sleep hygiene should be reviewed to assess factors that may improve sleep quality. - Weight management and regular exercise should be initiated or continued if  appropriate.  [Electronically signed] 11/16/2019 04:45  PM  Shelva Majestic MD, [facility], [place], American Board of Sleep Medicine   NPI: [id number] [place] PH: [phone]   FX: [phone] [place]

## 2019-11-18 ENCOUNTER — Ambulatory Visit: Payer: BC Managed Care – PPO | Admitting: Physician Assistant

## 2019-11-29 ENCOUNTER — Telehealth: Payer: Self-pay | Admitting: *Deleted

## 2019-11-29 NOTE — Telephone Encounter (Signed)
Called results lmtcb on home phone.

## 2019-11-29 NOTE — Telephone Encounter (Signed)
-----   Message from Troy Sine, MD sent at 11/16/2019  4:50 PM EDT ----- Mariann Laster please notify patient of the results and set up for CPAP titration study.

## 2019-12-28 ENCOUNTER — Other Ambulatory Visit: Payer: Self-pay

## 2019-12-28 ENCOUNTER — Encounter: Payer: Self-pay | Admitting: Physician Assistant

## 2019-12-28 ENCOUNTER — Ambulatory Visit (INDEPENDENT_AMBULATORY_CARE_PROVIDER_SITE_OTHER): Payer: BC Managed Care – PPO | Admitting: Physician Assistant

## 2019-12-28 VITALS — BP 120/78 | HR 79 | Temp 97.0°F | Ht 71.0 in | Wt 162.6 lb

## 2019-12-28 DIAGNOSIS — I4819 Other persistent atrial fibrillation: Secondary | ICD-10-CM

## 2019-12-28 DIAGNOSIS — F1010 Alcohol abuse, uncomplicated: Secondary | ICD-10-CM

## 2019-12-28 DIAGNOSIS — G4733 Obstructive sleep apnea (adult) (pediatric): Secondary | ICD-10-CM

## 2019-12-28 DIAGNOSIS — I10 Essential (primary) hypertension: Secondary | ICD-10-CM

## 2019-12-28 NOTE — Progress Notes (Signed)
Cardiology Office Note:    Date:  12/30/2019   ID:  Juan [name], DOB 1966/04/04, MRN [id number]  PCP:  Lorene Dy, MD  [place] Cardiologist:  Peter Martinique, MD  [place] Electrophysiologist:  None  Sleep specialist/CPAP: Dr. [name]  Referring MD: Lorene Dy, MD   No chief complaint on file.   History of Present Illness:    Juan [name] is a 54 y.o. male with a hx of atrial fibrillation, CPAP and hypertension. Patient was diagnosed with atrial fibrillation recently by his PCP and was referred to Dr. Martinique.  He saw Dr. Martinique on 10/16/2019.  Patient does drink about 3-6 beers per day.  His wife also reported loud snoring and apneic spells.  After discussing various options, he was started on Xarelto 20 mg daily.  Alcohol cessation has been discussed with the patient.  Echocardiogram obtained on 10/05/2019 showed EF 55 to 60%, grade 1 DD, normal left atrial size, aortic dilatation measuring at 38 mm.  He underwent a sleep study in June 2021 and was noted to have moderate obstructive sleep apnea, no significant central sleep apnea.  Nocturnal O2 saturation reached nadir of 89% during REM sleep.  CPAP titration was recommended.  Patient presents today for cardiology office visit.  Originally, he was planned to consider cardioversion, however based on today's EKG, he has self converted on the metoprolol.  He tried to cut back on alcohol.  He had a positive sleep study over a month ago however he has not been able to start on the CPAP therapy.  Our sleep coordinator did call him and left him a message.  We will reach out to the sleep coordinator to see what else is needed to be done.  He will need to follow-up with Dr. [name] for management of obstructive sleep apnea as well.  Otherwise I recommended continuing the current therapy and follow-up with Dr. Martinique in 2 months.  He does admit that he may have missed a few days of Xarelto.  He need to be more compliant with  anticoagulation therapy.  Once his alcohol usage has stopped and obstructive sleep apnea and managed, we can readdress on the duration of anticoagulation therapy given CHA2DS2-Vasc score of 1.   Past Medical History:  Diagnosis Date  . Anxiety   . Atypical nevus 02/06/2015   Right Back - Mild  . Atypical nevus 04/30/2016   Right Chest - Mild  . Atypical nevus 04/30/2016   Right Shoulder - Mild  . Chronic back pain   . Hypertension   . Lumbar disc disease   . Psoriasis     Past Surgical History:  Procedure Laterality Date  . APPENDECTOMY    . SHOULDER SURGERY Left     Current Medications: Current Meds  Medication Sig  . ALPRAZolam (XANAX) 1 MG tablet Take 1 mg by mouth at bedtime as needed.  Marland Kitchen amLODipine (NORVASC) 5 MG tablet Take 5 mg by mouth daily.  Marland Kitchen Apremilast (OTEZLA) 30 MG TABS Take 1 tablet (30 mg total) by mouth 2 (two) times daily.  . ciprofloxacin (CIPRO) 500 MG tablet Take 500 mg by mouth 2 (two) times daily.  . clobetasol (OLUX) 0.05 % topical foam APPLY TO SCALP DAILY AS NEEDED  . ENSTILAR 0.005-0.064 % FOAM APPLY TOPICALLY TO THE AFFECTED AREA 1 TIME FOR 1 DOSE.  Marland Kitchen HYDROcodone-acetaminophen (NORCO) 10-325 MG tablet Take 1 tablet by mouth every 6 (six) hours as needed.  Marland Kitchen HYDROcodone-acetaminophen (NORCO) 7.5-325 MG per  tablet Take 1 tablet by mouth every 6 (six) hours as needed for moderate pain.  Marland Kitchen lisinopril (PRINIVIL,ZESTRIL) 40 MG tablet Take 40 mg by mouth daily.  . meloxicam (MOBIC) 7.5 MG tablet Take 1 tablet (7.5 mg total) by mouth daily.  . metoprolol succinate (TOPROL-XL) 25 MG 24 hr tablet Take 25 mg by mouth daily.  . rivaroxaban (XARELTO) 20 MG TABS tablet Take 1 tablet (20 mg total) by mouth daily with supper.  . sildenafil (REVATIO) 20 MG tablet Take 40 mg by mouth daily.  . valACYclovir (VALTREX) 1000 MG tablet Take 1,000 mg by mouth 2 (two) times daily.     Allergies:   Patient has no known allergies.   Social History   Socioeconomic  History  . Marital status: Married    Spouse name: Not on file  . Number of children: 1  . Years of education: Not on file  . Highest education level: Not on file  Occupational History  . Occupation: Animator  Tobacco Use  . Smoking status: Never Smoker  . Smokeless tobacco: Never Used  Substance and Sexual Activity  . Alcohol use: Yes    Alcohol/week: 0.0 standard drinks    Comment: 3-6 beers per day  . Drug use: No  . Sexual activity: Yes  Other Topics Concern  . Not on file  Social History Narrative  . Not on file   Social Determinants of Health   Financial Resource Strain:   . Difficulty of Paying Living Expenses:   Food Insecurity:   . Worried About Charity fundraiser in the Last Year:   . Arboriculturist in the Last Year:   Transportation Needs:   . Film/video editor (Medical):   Marland Kitchen Lack of Transportation (Non-Medical):   Physical Activity:   . Days of Exercise per Week:   . Minutes of Exercise per Session:   Stress:   . Feeling of Stress :   Social Connections:   . Frequency of Communication with Friends and Family:   . Frequency of Social Gatherings with Friends and Family:   . Attends Religious Services:   . Active Member of Clubs or Organizations:   . Attends Archivist Meetings:   Marland Kitchen Marital Status:      Family History: The patient's family history includes Cancer in his mother; Heart attack (age of onset: 39) in his mother; Heart disease in his mother; Hypertension in his sister.  ROS:   Please see the history of present illness.     All other systems reviewed and are negative.  EKGs/Labs/Other Studies Reviewed:    The following studies were reviewed today:  Echo 10/05/2019 1. Left ventricular ejection fraction, by estimation, is 55 to 60%. The  left ventricle has normal function. The left ventricle has no regional  wall motion abnormalities. There is moderate concentric left ventricular  hypertrophy. Left ventricular    diastolic parameters are consistent with Grade I diastolic dysfunction  (impaired relaxation).  2. Right ventricular systolic function is normal. The right ventricular  size is normal.  3. The mitral valve is normal in structure. No evidence of mitral valve  regurgitation. No evidence of mitral stenosis.  4. The aortic valve is tricuspid. Aortic valve regurgitation is trivial.  No aortic stenosis is present.  5. Aortic dilatation noted. There is mild dilatation at the level of the  sinuses of Valsalva measuring 38 mm.  6. The inferior vena cava is normal in size with greater  than 50%  respiratory variability, suggesting right atrial pressure of 3 mmHg.   EKG:  EKG is ordered today.  The ekg ordered today demonstrates normal sinus rhythm, no significant ST-T wave changes.  Recent Labs: No results found for requested labs within last 8760 hours.  Recent Lipid Panel No results found for: CHOL, TRIG, HDL, CHOLHDL, VLDL, LDLCALC, LDLDIRECT  Physical Exam:    VS:  BP 120/78   Pulse 79   Temp (!) 97 F (36.1 C)   Ht 5\' 11"  (1.803 m)   Wt 162 lb 9.6 oz (73.8 kg)   SpO2 99%   BMI 22.68 kg/m     Wt Readings from Last 3 Encounters:  12/28/19 162 lb 9.6 oz (73.8 kg)  11/11/19 165 lb (74.8 kg)  09/19/19 167 lb 6.4 oz (75.9 kg)     GEN:  Well nourished, well developed in no acute distress HEENT: Normal NECK: No JVD; No carotid bruits LYMPHATICS: No lymphadenopathy CARDIAC: RRR, no murmurs, rubs, gallops RESPIRATORY:  Clear to auscultation without rales, wheezing or rhonchi  ABDOMEN: Soft, non-tender, non-distended MUSCULOSKELETAL:  No edema; No deformity  SKIN: Warm and dry NEUROLOGIC:  Alert and oriented x 3 PSYCHIATRIC:  Normal affect   ASSESSMENT:    1. Persistent atrial fibrillation ([place])   2. OSA (obstructive sleep apnea)   3. Essential hypertension   4. ETOH abuse    PLAN:    In order of problems listed above:  1. Persistent atrial fibrillation:  CHA2DS2-Vasc score 1 (HTN), currently on metoprolol and Xarelto.  He has self converted on rate controlling agent.  Unfortunately with alcohol usage, recurrence of atrial fibrillation is high.  Once he is able to completely stop drinking, we will reassess the duration of Xarelto.  2. Obstructive sleep apnea: We discussed that his recent study showed moderate obstructive sleep apnea.  We will send message to our sleep coordinator to set up a CPAP titration study.  3. Hypertension: Blood pressure stable  4. EtOH abuse: Likely contributing to his atrial fibrillation.  Strongly advised to stop drinking.   Medication Adjustments/Labs and Tests Ordered: Current medicines are reviewed at length with the patient today.  Concerns regarding medicines are outlined above.  Orders Placed This Encounter  Procedures  . EKG 12-Lead   No orders of the defined types were placed in this encounter.   Patient Instructions  Medication Instructions:  Your physician recommends that you continue on your current medications as directed. Please refer to the Current Medication list given to you today.  *If you need a refill on your cardiac medications before your next appointment, please call your pharmacy*  Lab Work: NONE ordered at this time of appointment   If you have labs (blood work) drawn today and your tests are completely normal, you will receive your results only by: Marland Kitchen MyChart Message (if you have MyChart) OR . A paper copy in the mail If you have any lab test that is abnormal or we need to change your treatment, we will call you to review the results.  Testing/Procedures: NONE ordered at this time of appointment   Follow-Up: At [facility], you and your health needs are our priority.  As part of our continuing mission to provide you with exceptional heart care, we have created designated Provider Care Teams.  These Care Teams include your primary Cardiologist (physician) and Advanced Practice  Providers (APPs -  Physician Assistants and Nurse Practitioners) who all work together to provide you with the care  you need, when you need it.  We recommend signing up for the patient portal called "MyChart".  Sign up information is provided on this After Visit Summary.  MyChart is used to connect with patients for Virtual Visits (Telemedicine).  Patients are able to view lab/test results, encounter notes, upcoming appointments, etc.  Non-urgent messages can be sent to your provider as well.   To learn more about what you can do with MyChart, go to [email or web address].    Your next appointment:   2 month(s) Follow up Sleep Clinic  The format for your next appointment:   In Person In Person   Provider:   Peter Martinique, MD Shelva Majestic, MD  Other Instructions      Signed, Almyra Deforest, [place]  12/30/2019 9:04 AM    [place]

## 2019-12-28 NOTE — Patient Instructions (Signed)
Medication Instructions:  Your physician recommends that you continue on your current medications as directed. Please refer to the Current Medication list given to you today.  *If you need a refill on your cardiac medications before your next appointment, please call your pharmacy*  Lab Work: NONE ordered at this time of appointment   If you have labs (blood work) drawn today and your tests are completely normal, you will receive your results only by: Marland Kitchen MyChart Message (if you have MyChart) OR . A paper copy in the mail If you have any lab test that is abnormal or we need to change your treatment, we will call you to review the results.  Testing/Procedures: NONE ordered at this time of appointment   Follow-Up: At [facility], you and your health needs are our priority.  As part of our continuing mission to provide you with exceptional heart care, we have created designated Provider Care Teams.  These Care Teams include your primary Cardiologist (physician) and Advanced Practice Providers (APPs -  Physician Assistants and Nurse Practitioners) who all work together to provide you with the care you need, when you need it.  We recommend signing up for the patient portal called "MyChart".  Sign up information is provided on this After Visit Summary.  MyChart is used to connect with patients for Virtual Visits (Telemedicine).  Patients are able to view lab/test results, encounter notes, upcoming appointments, etc.  Non-urgent messages can be sent to your provider as well.   To learn more about what you can do with MyChart, go to [email or web address].    Your next appointment:   2 month(s) Follow up Sleep Clinic  The format for your next appointment:   In Person In Person   Provider:   Peter Martinique, MD Shelva Majestic, MD  Other Instructions

## 2019-12-29 ENCOUNTER — Other Ambulatory Visit: Payer: Self-pay | Admitting: Cardiovascular Disease

## 2019-12-29 ENCOUNTER — Telehealth: Payer: Self-pay | Admitting: *Deleted

## 2019-12-29 DIAGNOSIS — G4733 Obstructive sleep apnea (adult) (pediatric): Secondary | ICD-10-CM

## 2019-12-29 NOTE — Telephone Encounter (Signed)
Patient notified of CPAP titration appointment. Sleep study results were given to the patient when he was seen by Almyra Deforest.

## 2020-01-19 ENCOUNTER — Telehealth: Payer: Self-pay

## 2020-01-19 NOTE — Telephone Encounter (Signed)
LVM2CB- pt needs an appt with Dr.[name] 9/16 or 9/17 for sleep.

## 2020-01-26 NOTE — Telephone Encounter (Signed)
Pt scheduled 02/02/20 at 1:40pm

## 2020-01-31 ENCOUNTER — Other Ambulatory Visit: Payer: Self-pay

## 2020-01-31 ENCOUNTER — Ambulatory Visit (HOSPITAL_BASED_OUTPATIENT_CLINIC_OR_DEPARTMENT_OTHER): Payer: BC Managed Care – PPO | Attending: Cardiovascular Disease | Admitting: Cardiovascular Disease

## 2020-01-31 DIAGNOSIS — G4733 Obstructive sleep apnea (adult) (pediatric): Secondary | ICD-10-CM | POA: Insufficient documentation

## 2020-02-02 ENCOUNTER — Telehealth: Payer: Self-pay | Admitting: *Deleted

## 2020-02-02 ENCOUNTER — Ambulatory Visit: Payer: BC Managed Care – PPO | Admitting: Cardiovascular Disease

## 2020-02-02 NOTE — Telephone Encounter (Signed)
Returned a call to patient to explain the next step in the sleep process. Patient not available. Mailbox full could not leave a message. I will try again later.

## 2020-02-02 NOTE — Telephone Encounter (Signed)
Patient notified sleep study has not been processed and read. Once this has been done the reading physician will give it to me to order his CPAP machine. He will be notified once this has been done. Patient voiced verbal understanding.

## 2020-02-13 ENCOUNTER — Emergency Department (HOSPITAL_COMMUNITY): Payer: BC Managed Care – PPO

## 2020-02-13 ENCOUNTER — Encounter (HOSPITAL_COMMUNITY): Payer: Self-pay | Admitting: Radiology

## 2020-02-13 ENCOUNTER — Inpatient Hospital Stay (HOSPITAL_COMMUNITY)
Admission: EM | Admit: 2020-02-13 | Discharge: 2020-02-18 | DRG: 083 | Disposition: A | Discharge status: Home / Self Care | Payer: BC Managed Care – PPO | Attending: Physician Assistant | Admitting: Physician Assistant

## 2020-02-13 ENCOUNTER — Other Ambulatory Visit: Payer: Self-pay

## 2020-02-13 DIAGNOSIS — M5137 Other intervertebral disc degeneration, lumbosacral region: Secondary | ICD-10-CM | POA: Diagnosis not present

## 2020-02-13 DIAGNOSIS — T07XXXA Unspecified multiple injuries, initial encounter: Secondary | ICD-10-CM | POA: Diagnosis not present

## 2020-02-13 DIAGNOSIS — Y92039 Unspecified place in apartment as the place of occurrence of the external cause: Secondary | ICD-10-CM | POA: Diagnosis not present

## 2020-02-13 DIAGNOSIS — Z23 Encounter for immunization: Secondary | ICD-10-CM | POA: Diagnosis not present

## 2020-02-13 DIAGNOSIS — F1910 Other psychoactive substance abuse, uncomplicated: Secondary | ICD-10-CM | POA: Diagnosis not present

## 2020-02-13 DIAGNOSIS — S066X9A Traumatic subarachnoid hemorrhage with loss of consciousness of unspecified duration, initial encounter: Principal | ICD-10-CM | POA: Diagnosis present

## 2020-02-13 DIAGNOSIS — F1410 Cocaine abuse, uncomplicated: Secondary | ICD-10-CM | POA: Diagnosis present

## 2020-02-13 DIAGNOSIS — M51379 Other intervertebral disc degeneration, lumbosacral region without mention of lumbar back pain or lower extremity pain: Secondary | ICD-10-CM

## 2020-02-13 DIAGNOSIS — I2510 Atherosclerotic heart disease of native coronary artery without angina pectoris: Secondary | ICD-10-CM | POA: Diagnosis present

## 2020-02-13 DIAGNOSIS — R471 Dysarthria and anarthria: Secondary | ICD-10-CM | POA: Diagnosis present

## 2020-02-13 DIAGNOSIS — S065X9A Traumatic subdural hemorrhage with loss of consciousness of unspecified duration, initial encounter: Secondary | ICD-10-CM | POA: Diagnosis present

## 2020-02-13 DIAGNOSIS — F17210 Nicotine dependence, cigarettes, uncomplicated: Secondary | ICD-10-CM | POA: Diagnosis present

## 2020-02-13 DIAGNOSIS — H0520 Unspecified exophthalmos: Secondary | ICD-10-CM | POA: Diagnosis present

## 2020-02-13 DIAGNOSIS — I609 Nontraumatic subarachnoid hemorrhage, unspecified: Secondary | ICD-10-CM

## 2020-02-13 DIAGNOSIS — R402352 Coma scale, best motor response, localizes pain, at arrival to emergency department: Secondary | ICD-10-CM | POA: Diagnosis present

## 2020-02-13 DIAGNOSIS — I4891 Unspecified atrial fibrillation: Secondary | ICD-10-CM | POA: Diagnosis present

## 2020-02-13 DIAGNOSIS — S0285XA Fracture of orbit, unspecified, initial encounter for closed fracture: Secondary | ICD-10-CM | POA: Diagnosis present

## 2020-02-13 DIAGNOSIS — Z8249 Family history of ischemic heart disease and other diseases of the circulatory system: Secondary | ICD-10-CM | POA: Diagnosis not present

## 2020-02-13 DIAGNOSIS — Z79899 Other long term (current) drug therapy: Secondary | ICD-10-CM

## 2020-02-13 DIAGNOSIS — E876 Hypokalemia: Secondary | ICD-10-CM | POA: Diagnosis present

## 2020-02-13 DIAGNOSIS — Y906 Blood alcohol level of 120-199 mg/100 ml: Secondary | ICD-10-CM | POA: Diagnosis present

## 2020-02-13 DIAGNOSIS — S0240FA Zygomatic fracture, left side, initial encounter for closed fracture: Secondary | ICD-10-CM | POA: Diagnosis present

## 2020-02-13 DIAGNOSIS — I629 Nontraumatic intracranial hemorrhage, unspecified: Secondary | ICD-10-CM

## 2020-02-13 DIAGNOSIS — S069X9A Unspecified intracranial injury with loss of consciousness of unspecified duration, initial encounter: Secondary | ICD-10-CM

## 2020-02-13 DIAGNOSIS — E878 Other disorders of electrolyte and fluid balance, not elsewhere classified: Secondary | ICD-10-CM | POA: Diagnosis present

## 2020-02-13 DIAGNOSIS — Z7901 Long term (current) use of anticoagulants: Secondary | ICD-10-CM | POA: Diagnosis not present

## 2020-02-13 DIAGNOSIS — E871 Hypo-osmolality and hyponatremia: Secondary | ICD-10-CM | POA: Diagnosis present

## 2020-02-13 DIAGNOSIS — S0081XA Abrasion of other part of head, initial encounter: Secondary | ICD-10-CM | POA: Diagnosis present

## 2020-02-13 DIAGNOSIS — F419 Anxiety disorder, unspecified: Secondary | ICD-10-CM | POA: Diagnosis present

## 2020-02-13 DIAGNOSIS — S80212A Abrasion, left knee, initial encounter: Secondary | ICD-10-CM | POA: Diagnosis present

## 2020-02-13 DIAGNOSIS — S0012XA Contusion of left eyelid and periocular area, initial encounter: Secondary | ICD-10-CM | POA: Diagnosis present

## 2020-02-13 DIAGNOSIS — S0240DA Maxillary fracture, left side, initial encounter for closed fracture: Secondary | ICD-10-CM | POA: Diagnosis present

## 2020-02-13 DIAGNOSIS — S0083XA Contusion of other part of head, initial encounter: Secondary | ICD-10-CM | POA: Diagnosis present

## 2020-02-13 DIAGNOSIS — R402232 Coma scale, best verbal response, inappropriate words, at arrival to emergency department: Secondary | ICD-10-CM | POA: Diagnosis present

## 2020-02-13 DIAGNOSIS — I10 Essential (primary) hypertension: Secondary | ICD-10-CM

## 2020-02-13 DIAGNOSIS — F10129 Alcohol abuse with intoxication, unspecified: Secondary | ICD-10-CM | POA: Diagnosis present

## 2020-02-13 DIAGNOSIS — I700 Atherosclerosis of aorta: Secondary | ICD-10-CM | POA: Diagnosis present

## 2020-02-13 DIAGNOSIS — Z791 Long term (current) use of non-steroidal anti-inflammatories (NSAID): Secondary | ICD-10-CM

## 2020-02-13 DIAGNOSIS — Z638 Other specified problems related to primary support group: Secondary | ICD-10-CM

## 2020-02-13 DIAGNOSIS — G4733 Obstructive sleep apnea (adult) (pediatric): Secondary | ICD-10-CM | POA: Diagnosis present

## 2020-02-13 DIAGNOSIS — T1490XA Injury, unspecified, initial encounter: Secondary | ICD-10-CM | POA: Diagnosis present

## 2020-02-13 DIAGNOSIS — R402132 Coma scale, eyes open, to sound, at arrival to emergency department: Secondary | ICD-10-CM | POA: Diagnosis present

## 2020-02-13 DIAGNOSIS — S0292XA Unspecified fracture of facial bones, initial encounter for closed fracture: Secondary | ICD-10-CM

## 2020-02-13 DIAGNOSIS — Z20822 Contact with and (suspected) exposure to covid-19: Secondary | ICD-10-CM | POA: Diagnosis present

## 2020-02-13 DIAGNOSIS — R451 Restlessness and agitation: Secondary | ICD-10-CM | POA: Diagnosis present

## 2020-02-13 DIAGNOSIS — L409 Psoriasis, unspecified: Secondary | ICD-10-CM | POA: Diagnosis present

## 2020-02-13 HISTORY — DX: Other psychoactive substance abuse, uncomplicated: F19.10

## 2020-02-13 HISTORY — DX: Alcohol abuse, uncomplicated: F10.10

## 2020-02-13 LAB — COMPREHENSIVE METABOLIC PANEL
ALT: 13 U/L (ref 0–44)
AST: 22 U/L (ref 15–41)
Albumin: 3.4 g/dL — ABNORMAL LOW (ref 3.5–5.0)
Alkaline Phosphatase: 90 U/L (ref 38–126)
Anion gap: 12 (ref 5–15)
BUN: 7 mg/dL (ref 6–20)
CO2: 22 mmol/L (ref 22–32)
Calcium: 8.6 mg/dL — ABNORMAL LOW (ref 8.9–10.3)
Chloride: 93 mmol/L — ABNORMAL LOW (ref 98–111)
Creatinine, Ser: 0.64 mg/dL (ref 0.61–1.24)
GFR calc Af Amer: >60 mL/min (ref >60)
GFR calc non Af Amer: >60 mL/min (ref >60)
Glucose, Bld: 76 mg/dL (ref 70–99)
Potassium: 3.3 mmol/L — ABNORMAL LOW (ref 3.5–5.1)
Sodium: 127 mmol/L — ABNORMAL LOW (ref 135–145)
Total Bilirubin: 0.9 mg/dL (ref 0.3–1.2)
Total Protein: 6.5 g/dL (ref 6.5–8.1)

## 2020-02-13 LAB — CBC
HCT: 44.2 % (ref 39.0–52.0)
Hemoglobin: 15.3 g/dL (ref 13.0–17.0)
MCH: 32.1 pg (ref 26.0–34.0)
MCHC: 34.6 g/dL (ref 30.0–36.0)
MCV: 92.9 fL (ref 80.0–100.0)
Platelets: 196 10*3/uL (ref 150–400)
RBC: 4.76 MIL/uL (ref 4.22–5.81)
RDW: 11.4 % — ABNORMAL LOW (ref 11.5–15.5)
WBC: 8.2 10*3/uL (ref 4.0–10.5)
nRBC: 0 % (ref 0.0–0.2)

## 2020-02-13 LAB — RAPID URINE DRUG SCREEN, HOSP PERFORMED
Amphetamines: NOT DETECTED
Barbiturates: NOT DETECTED
Benzodiazepines: POSITIVE — AB
Cocaine: POSITIVE — AB
Opiates: NOT DETECTED
Tetrahydrocannabinol: NOT DETECTED

## 2020-02-13 LAB — URINALYSIS, ROUTINE W REFLEX MICROSCOPIC
Bilirubin Urine: NEGATIVE
Glucose, UA: NEGATIVE mg/dL
Hgb urine dipstick: NEGATIVE
Ketones, ur: 5 mg/dL — AB
Leukocytes,Ua: NEGATIVE
Nitrite: NEGATIVE
Protein, ur: NEGATIVE mg/dL
Specific Gravity, Urine: 1.011 (ref 1.005–1.030)
pH: 7 (ref 5.0–8.0)

## 2020-02-13 LAB — LACTIC ACID, PLASMA: Lactic Acid, Venous: 2.2 mmol/L (ref 0.5–1.9)

## 2020-02-13 LAB — PROTIME-INR
INR: 0.9 (ref 0.8–1.2)
Prothrombin Time: 12.1 seconds (ref 11.4–15.2)

## 2020-02-13 LAB — I-STAT CHEM 8, ED
BUN: 8 mg/dL (ref 6–20)
Calcium, Ion: 1.02 mmol/L — ABNORMAL LOW (ref 1.15–1.40)
Chloride: 90 mmol/L — ABNORMAL LOW (ref 98–111)
Creatinine, Ser: 1 mg/dL (ref 0.61–1.24)
Glucose, Bld: 76 mg/dL (ref 70–99)
HCT: 44 % (ref 39.0–52.0)
Hemoglobin: 15 g/dL (ref 13.0–17.0)
Potassium: 3.3 mmol/L — ABNORMAL LOW (ref 3.5–5.1)
Sodium: 129 mmol/L — ABNORMAL LOW (ref 135–145)
TCO2: 25 mmol/L (ref 22–32)

## 2020-02-13 LAB — CBG MONITORING, ED: Glucose-Capillary: 83 mg/dL (ref 70–99)

## 2020-02-13 LAB — HIV ANTIBODY (ROUTINE TESTING W REFLEX): HIV Screen 4th Generation wRfx: NONREACTIVE

## 2020-02-13 LAB — MAGNESIUM: Magnesium: 1.2 mg/dL — ABNORMAL LOW (ref 1.7–2.4)

## 2020-02-13 LAB — RESPIRATORY PANEL BY RT PCR (FLU A&B, COVID)
Influenza A by PCR: NEGATIVE
Influenza B by PCR: NEGATIVE
SARS Coronavirus 2 by RT PCR: NEGATIVE

## 2020-02-13 LAB — ETHANOL: Alcohol, Ethyl (B): 151 mg/dL — ABNORMAL HIGH (ref <10)

## 2020-02-13 LAB — PHOSPHORUS: Phosphorus: 3 mg/dL (ref 2.5–4.6)

## 2020-02-13 LAB — HEPARIN LEVEL (UNFRACTIONATED): Heparin Unfractionated: <0.1 IU/mL — ABNORMAL LOW (ref 0.30–0.70)

## 2020-02-13 LAB — SAMPLE TO BLOOD BANK

## 2020-02-13 MED ORDER — SODIUM CHLORIDE 0.9 % IV BOLUS
500.0000 mL | Freq: Once | INTRAVENOUS | Status: AC
  Administered 2020-02-13: 500 mL via INTRAVENOUS

## 2020-02-13 MED ORDER — SODIUM CHLORIDE 0.9 % IV SOLN: INTRAVENOUS | Status: DC

## 2020-02-13 MED ORDER — NALOXONE HCL 0.4 MG/ML IJ SOLN: 0.4000 mg | Freq: Once | INTRAMUSCULAR | Status: DC

## 2020-02-13 MED ORDER — PANTOPRAZOLE SODIUM 40 MG PO TBEC
40.0000 mg | DELAYED_RELEASE_TABLET | Freq: Every day | ORAL | Status: DC
  Administered 2020-02-16 – 2020-02-18 (×3): 40 mg via ORAL
  Filled 2020-02-13 (×3): qty 1

## 2020-02-13 MED ORDER — BACITRACIN ZINC 500 UNIT/GM EX OINT
TOPICAL_OINTMENT | Freq: Two times a day (BID) | CUTANEOUS | Status: DC
  Administered 2020-02-13 – 2020-02-14 (×2): 1 via TOPICAL
  Filled 2020-02-13 (×2): qty 28.35
  Filled 2020-02-13 (×2): qty 28.4

## 2020-02-13 MED ORDER — LEVETIRACETAM IN NACL 500 MG/100ML IV SOLN
500.0000 mg | Freq: Two times a day (BID) | INTRAVENOUS | Status: DC
  Administered 2020-02-13 – 2020-02-16 (×7): 500 mg via INTRAVENOUS
  Filled 2020-02-13 (×10): qty 100

## 2020-02-13 MED ORDER — CHLORHEXIDINE GLUCONATE 0.12 % MT SOLN
15.0000 mL | Freq: Two times a day (BID) | OROMUCOSAL | Status: DC
  Administered 2020-02-13 – 2020-02-17 (×9): 15 mL via OROMUCOSAL
  Filled 2020-02-13 (×9): qty 15

## 2020-02-13 MED ORDER — HYDROMORPHONE HCL 1 MG/ML IJ SOLN: 0.5000 mg | INTRAMUSCULAR | Status: DC | PRN

## 2020-02-13 MED ORDER — CEFAZOLIN SODIUM-DEXTROSE 2-4 GM/100ML-% IV SOLN
2.0000 g | Freq: Once | INTRAVENOUS | Status: AC
  Administered 2020-02-13: 2 g via INTRAVENOUS
  Filled 2020-02-13: qty 100

## 2020-02-13 MED ORDER — POTASSIUM CHLORIDE 10 MEQ/100ML IV SOLN
10.0000 meq | INTRAVENOUS | Status: AC
  Administered 2020-02-13 (×3): 10 meq via INTRAVENOUS
  Filled 2020-02-13 (×3): qty 100

## 2020-02-13 MED ORDER — IOHEXOL 300 MG/ML  SOLN
100.0000 mL | Freq: Once | INTRAMUSCULAR | Status: AC | PRN
  Administered 2020-02-13: 100 mL via INTRAVENOUS

## 2020-02-13 MED ORDER — POLYETHYLENE GLYCOL 3350 17 G PO PACK: 17.0000 g | PACK | Freq: Every day | ORAL | Status: DC | PRN

## 2020-02-13 MED ORDER — SODIUM CHLORIDE 0.9 % IV SOLN: INTRAVENOUS | Status: DC | PRN

## 2020-02-13 MED ORDER — METOPROLOL TARTRATE 5 MG/5ML IV SOLN: 5.0000 mg | Freq: Four times a day (QID) | INTRAVENOUS | Status: DC | PRN

## 2020-02-13 MED ORDER — OXYCODONE HCL 5 MG PO TABS
5.0000 mg | ORAL_TABLET | ORAL | Status: DC | PRN
  Administered 2020-02-16 – 2020-02-18 (×8): 10 mg via ORAL
  Filled 2020-02-13 (×8): qty 2

## 2020-02-13 MED ORDER — BISACODYL 10 MG RE SUPP: 10.0000 mg | Freq: Every day | RECTAL | Status: DC | PRN

## 2020-02-13 MED ORDER — ADULT MULTIVITAMIN W/MINERALS CH
1.0000 | ORAL_TABLET | Freq: Every day | ORAL | Status: DC
  Administered 2020-02-15 – 2020-02-18 (×4): 1 via ORAL
  Filled 2020-02-13 (×5): qty 1

## 2020-02-13 MED ORDER — CHLORHEXIDINE GLUCONATE CLOTH 2 % EX PADS
6.0000 | MEDICATED_PAD | Freq: Every day | CUTANEOUS | Status: DC
  Administered 2020-02-13 – 2020-02-17 (×5): 6 via TOPICAL

## 2020-02-13 MED ORDER — ONDANSETRON 4 MG PO TBDP
4.0000 mg | ORAL_TABLET | Freq: Four times a day (QID) | ORAL | Status: DC | PRN
  Administered 2020-02-17: 4 mg via ORAL
  Filled 2020-02-13 (×2): qty 1

## 2020-02-13 MED ORDER — ONDANSETRON HCL 4 MG/2ML IJ SOLN: 4.0000 mg | Freq: Four times a day (QID) | INTRAMUSCULAR | Status: DC | PRN

## 2020-02-13 MED ORDER — SALINE SPRAY 0.65 % NA SOLN
1.0000 | NASAL | Status: DC | PRN
  Administered 2020-02-13: 1 via NASAL
  Filled 2020-02-13 (×2): qty 44

## 2020-02-13 MED ORDER — ORAL CARE MOUTH RINSE
15.0000 mL | Freq: Two times a day (BID) | OROMUCOSAL | Status: DC
  Administered 2020-02-13 – 2020-02-17 (×6): 15 mL via OROMUCOSAL

## 2020-02-13 MED ORDER — DOCUSATE SODIUM 100 MG PO CAPS
100.0000 mg | ORAL_CAPSULE | Freq: Two times a day (BID) | ORAL | Status: DC
  Administered 2020-02-15 – 2020-02-18 (×7): 100 mg via ORAL
  Filled 2020-02-13 (×8): qty 1

## 2020-02-13 MED ORDER — PANTOPRAZOLE SODIUM 40 MG IV SOLR
40.0000 mg | Freq: Every day | INTRAVENOUS | Status: DC
  Administered 2020-02-13 – 2020-02-15 (×3): 40 mg via INTRAVENOUS
  Filled 2020-02-13 (×3): qty 40

## 2020-02-13 MED ORDER — ACETAMINOPHEN 325 MG PO TABS: 650.0000 mg | ORAL_TABLET | ORAL | Status: DC | PRN

## 2020-02-13 MED ORDER — FOLIC ACID 1 MG PO TABS
1.0000 mg | ORAL_TABLET | Freq: Every day | ORAL | Status: DC
  Administered 2020-02-15 – 2020-02-18 (×4): 1 mg via ORAL
  Filled 2020-02-13 (×5): qty 1

## 2020-02-13 MED ORDER — THIAMINE HCL 100 MG/ML IJ SOLN
100.0000 mg | Freq: Every day | INTRAMUSCULAR | Status: DC
  Administered 2020-02-13 – 2020-02-15 (×3): 100 mg via INTRAVENOUS
  Filled 2020-02-13 (×3): qty 2

## 2020-02-13 MED ORDER — METHOCARBAMOL 1000 MG/10ML IJ SOLN
500.0000 mg | Freq: Four times a day (QID) | INTRAVENOUS | Status: DC | PRN
  Administered 2020-02-16: 500 mg via INTRAVENOUS
  Filled 2020-02-13 (×2): qty 5

## 2020-02-13 MED ORDER — TETANUS-DIPHTH-ACELL PERTUSSIS 5-2.5-18.5 LF-MCG/0.5 IM SUSP
0.5000 mL | Freq: Once | INTRAMUSCULAR | Status: AC
  Administered 2020-02-13: 0.5 mL via INTRAMUSCULAR
  Filled 2020-02-13: qty 0.5

## 2020-02-13 MED ORDER — LORAZEPAM 2 MG/ML IJ SOLN
1.0000 mg | INTRAMUSCULAR | Status: AC | PRN
  Administered 2020-02-13 – 2020-02-14 (×2): 4 mg via INTRAVENOUS
  Filled 2020-02-13 (×2): qty 2
  Filled 2020-02-13: qty 1

## 2020-02-13 MED ORDER — THIAMINE HCL 100 MG PO TABS
100.0000 mg | ORAL_TABLET | Freq: Every day | ORAL | Status: DC
  Administered 2020-02-16 – 2020-02-18 (×3): 100 mg via ORAL
  Filled 2020-02-13 (×3): qty 1

## 2020-02-13 MED ORDER — LORAZEPAM 1 MG PO TABS: 1.0000 mg | ORAL_TABLET | ORAL | Status: AC | PRN

## 2020-02-13 NOTE — ED Notes (Signed)
Wife called for further update.

## 2020-02-13 NOTE — Plan of Care (Signed)
  Problem: Clinical Measurements: Goal: Will remain free from infection Outcome: Progressing Goal: Cardiovascular complication will be avoided Outcome: Progressing   Problem: Elimination: Goal: Will not experience complications related to urinary retention Outcome: Progressing   Problem: Safety: Goal: Ability to remain free from injury will improve Outcome: Progressing   Problem: Skin Integrity: Goal: Risk for impaired skin integrity will decrease Outcome: Progressing

## 2020-02-13 NOTE — ED Notes (Signed)
Wife updated

## 2020-02-13 NOTE — ED Notes (Signed)
Wife called and aware patient has been trasported to [id number]

## 2020-02-13 NOTE — ED Notes (Signed)
Patient incont for urine , cleaned and new linens applied. Saline dressing applied to left eye male external cath applied.

## 2020-02-13 NOTE — Consult Note (Signed)
Responded to page, pt unavailable, no family present, please call again if further chaplain services needed, will refer to day chaplain.  Rev. Eloise Levels Chaplain
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
## 2020-02-13 NOTE — ED Notes (Signed)
Pt's CBG result was 83. Informed [name] - RN.
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
## 2020-02-13 NOTE — H&P (Signed)
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 meds.  Alferd Apa, [facility] 02/13/2020, 11:50 AM Please see Amion for pager number during day hours 7:00am-4:30pm
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
## 2020-02-13 NOTE — Progress Notes (Addendum)
Pt arrived to ICU w/ clothes, shoes, cigarettes, keys, a wallet w/ no cash and no electronics.
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
## 2020-02-13 NOTE — ED Provider Notes (Signed)
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    None       Amaryllis Dyke, PA-C 02/13/20 [place], [place], DO 02/13/20 1451
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
## 2020-02-14 ENCOUNTER — Inpatient Hospital Stay (HOSPITAL_COMMUNITY): Payer: BC Managed Care – PPO

## 2020-02-14 ENCOUNTER — Other Ambulatory Visit: Payer: Self-pay

## 2020-02-14 LAB — CBC
HCT: 42.8 % (ref 39.0–52.0)
Hemoglobin: 15.5 g/dL (ref 13.0–17.0)
MCH: 32.1 pg (ref 26.0–34.0)
MCHC: 36.2 g/dL — ABNORMAL HIGH (ref 30.0–36.0)
MCV: 88.6 fL (ref 80.0–100.0)
Platelets: 193 10*3/uL (ref 150–400)
RBC: 4.83 MIL/uL (ref 4.22–5.81)
RDW: 10.9 % — ABNORMAL LOW (ref 11.5–15.5)
WBC: 6.9 10*3/uL (ref 4.0–10.5)
nRBC: 0 % (ref 0.0–0.2)

## 2020-02-14 LAB — BASIC METABOLIC PANEL
Anion gap: 13 (ref 5–15)
BUN: <5 mg/dL — ABNORMAL LOW (ref 6–20)
CO2: 21 mmol/L — ABNORMAL LOW (ref 22–32)
Calcium: 8.6 mg/dL — ABNORMAL LOW (ref 8.9–10.3)
Chloride: 89 mmol/L — ABNORMAL LOW (ref 98–111)
Creatinine, Ser: 0.54 mg/dL — ABNORMAL LOW (ref 0.61–1.24)
GFR calc Af Amer: >60 mL/min (ref >60)
GFR calc non Af Amer: >60 mL/min (ref >60)
Glucose, Bld: 76 mg/dL (ref 70–99)
Potassium: 3.4 mmol/L — ABNORMAL LOW (ref 3.5–5.1)
Sodium: 123 mmol/L — ABNORMAL LOW (ref 135–145)

## 2020-02-14 LAB — MAGNESIUM: Magnesium: 1.4 mg/dL — ABNORMAL LOW (ref 1.7–2.4)

## 2020-02-14 MED ORDER — MAGNESIUM SULFATE 4 GM/100ML IV SOLN
4.0000 g | Freq: Once | INTRAVENOUS | Status: AC
  Administered 2020-02-14: 4 g via INTRAVENOUS
  Filled 2020-02-14: qty 100

## 2020-02-14 MED ORDER — POTASSIUM CHLORIDE 20 MEQ PO PACK: 20.0000 meq | PACK | Freq: Two times a day (BID) | ORAL | Status: DC

## 2020-02-14 MED ORDER — INFLUENZA VAC SPLIT QUAD 0.5 ML IM SUSY
0.5000 mL | PREFILLED_SYRINGE | INTRAMUSCULAR | Status: DC
  Filled 2020-02-14 (×2): qty 0.5

## 2020-02-14 MED ORDER — POTASSIUM CHLORIDE 10 MEQ/100ML IV SOLN
10.0000 meq | INTRAVENOUS | Status: AC
  Administered 2020-02-14 (×4): 10 meq via INTRAVENOUS
  Filled 2020-02-14 (×5): qty 100

## 2020-02-14 MED ORDER — PNEUMOCOCCAL VAC POLYVALENT 25 MCG/0.5ML IJ INJ
0.5000 mL | INJECTION | INTRAMUSCULAR | Status: DC
  Filled 2020-02-14: qty 0.5
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
## 2020-02-14 NOTE — TOC CAGE-AID Note (Signed)
Transition of Care [facility]) - CAGE-AID Screening   Patient Details  Name: Juan [name] MRN: [id number] Date of Birth: [birth year]  Transition of Care [facility]) CM/SW Contact:    Emeterio Reeve, [place] Phone Number: 02/14/2020, 1:38 PM   Clinical Narrative:  Pt is unable to participate in assessment due to being disoriented x4. Please re-consult when pt is able to participate.   CAGE-AID Screening: Substance Abuse Screening unable to be completed due to: : Patient unable to participate               [place] Clinical Social Worker [phone]
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
## 2020-02-15 DIAGNOSIS — F1910 Other psychoactive substance abuse, uncomplicated: Secondary | ICD-10-CM

## 2020-02-15 DIAGNOSIS — T07XXXA Unspecified multiple injuries, initial encounter: Secondary | ICD-10-CM

## 2020-02-15 DIAGNOSIS — M5137 Other intervertebral disc degeneration, lumbosacral region: Secondary | ICD-10-CM

## 2020-02-15 DIAGNOSIS — I609 Nontraumatic subarachnoid hemorrhage, unspecified: Secondary | ICD-10-CM

## 2020-02-15 DIAGNOSIS — T1490XA Injury, unspecified, initial encounter: Secondary | ICD-10-CM

## 2020-02-15 DIAGNOSIS — S0292XA Unspecified fracture of facial bones, initial encounter for closed fracture: Secondary | ICD-10-CM

## 2020-02-15 DIAGNOSIS — I629 Nontraumatic intracranial hemorrhage, unspecified: Secondary | ICD-10-CM

## 2020-02-15 DIAGNOSIS — S069X9A Unspecified intracranial injury with loss of consciousness of unspecified duration, initial encounter: Secondary | ICD-10-CM

## 2020-02-15 DIAGNOSIS — I10 Essential (primary) hypertension: Secondary | ICD-10-CM

## 2020-02-15 LAB — BASIC METABOLIC PANEL
Anion gap: 11 (ref 5–15)
BUN: 7 mg/dL (ref 6–20)
CO2: 23 mmol/L (ref 22–32)
Calcium: 8.8 mg/dL — ABNORMAL LOW (ref 8.9–10.3)
Chloride: 90 mmol/L — ABNORMAL LOW (ref 98–111)
Creatinine, Ser: 0.47 mg/dL — ABNORMAL LOW (ref 0.61–1.24)
GFR calc Af Amer: >60 mL/min (ref >60)
GFR calc non Af Amer: >60 mL/min (ref >60)
Glucose, Bld: 148 mg/dL — ABNORMAL HIGH (ref 70–99)
Potassium: 3.3 mmol/L — ABNORMAL LOW (ref 3.5–5.1)
Sodium: 124 mmol/L — ABNORMAL LOW (ref 135–145)

## 2020-02-15 LAB — MAGNESIUM: Magnesium: 1.8 mg/dL (ref 1.7–2.4)

## 2020-02-15 MED ORDER — AMLODIPINE BESYLATE 5 MG PO TABS
5.0000 mg | ORAL_TABLET | Freq: Every day | ORAL | Status: DC
  Administered 2020-02-15 – 2020-02-18 (×4): 5 mg via ORAL
  Filled 2020-02-15 (×4): qty 1

## 2020-02-15 MED ORDER — ALUM & MAG HYDROXIDE-SIMETH 200-200-20 MG/5ML PO SUSP: 30.0000 mL | Freq: Four times a day (QID) | ORAL | Status: DC | PRN

## 2020-02-15 MED ORDER — METOPROLOL SUCCINATE ER 25 MG PO TB24
25.0000 mg | ORAL_TABLET | Freq: Every day | ORAL | Status: DC
  Administered 2020-02-15 – 2020-02-18 (×4): 25 mg via ORAL
  Filled 2020-02-15 (×4): qty 1
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
## 2020-02-15 NOTE — Progress Notes (Signed)
Patient arrived to unit in NAD,VS stable. Patient oriented to room and call bell min reach.
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
## 2020-02-16 ENCOUNTER — Other Ambulatory Visit: Payer: Self-pay

## 2020-02-16 LAB — BASIC METABOLIC PANEL
Anion gap: 11 (ref 5–15)
BUN: 6 mg/dL (ref 6–20)
CO2: 23 mmol/L (ref 22–32)
Calcium: 8.8 mg/dL — ABNORMAL LOW (ref 8.9–10.3)
Chloride: 98 mmol/L (ref 98–111)
Creatinine, Ser: 0.55 mg/dL — ABNORMAL LOW (ref 0.61–1.24)
GFR calc Af Amer: >60 mL/min (ref >60)
GFR calc non Af Amer: >60 mL/min (ref >60)
Glucose, Bld: 101 mg/dL — ABNORMAL HIGH (ref 70–99)
Potassium: 3.4 mmol/L — ABNORMAL LOW (ref 3.5–5.1)
Sodium: 132 mmol/L — ABNORMAL LOW (ref 135–145)

## 2020-02-16 LAB — MAGNESIUM: Magnesium: 1.8 mg/dL (ref 1.7–2.4)

## 2020-02-16 MED ORDER — LEVETIRACETAM 500 MG PO TABS
500.0000 mg | ORAL_TABLET | Freq: Two times a day (BID) | ORAL | Status: DC
  Administered 2020-02-16 – 2020-02-18 (×4): 500 mg via ORAL
  Filled 2020-02-16 (×4): qty 1

## 2020-02-16 MED ORDER — ACETAMINOPHEN 500 MG PO TABS
1000.0000 mg | ORAL_TABLET | Freq: Three times a day (TID) | ORAL | Status: DC
  Administered 2020-02-16 – 2020-02-18 (×7): 1000 mg via ORAL
  Filled 2020-02-16 (×7): qty 2

## 2020-02-16 MED ORDER — MAGNESIUM SULFATE 50 % IJ SOLN
1.0000 g | Freq: Once | INTRAMUSCULAR | Status: DC
  Filled 2020-02-16: qty 2

## 2020-02-16 MED ORDER — LISINOPRIL 40 MG PO TABS
40.0000 mg | ORAL_TABLET | Freq: Every day | ORAL | Status: DC
  Administered 2020-02-16 – 2020-02-18 (×3): 40 mg via ORAL
  Filled 2020-02-16 (×3): qty 1

## 2020-02-16 MED ORDER — MAGNESIUM SULFATE IN D5W 1-5 GM/100ML-% IV SOLN
1.0000 g | Freq: Once | INTRAVENOUS | Status: AC
  Administered 2020-02-16: 1 g via INTRAVENOUS
  Filled 2020-02-16: qty 100

## 2020-02-16 MED ORDER — METHOCARBAMOL 500 MG PO TABS
500.0000 mg | ORAL_TABLET | Freq: Four times a day (QID) | ORAL | Status: DC | PRN
  Administered 2020-02-17 – 2020-02-18 (×3): 500 mg via ORAL
  Filled 2020-02-16 (×3): qty 1

## 2020-02-16 MED ORDER — HYDROMORPHONE HCL 1 MG/ML IJ SOLN: 0.5000 mg | INTRAMUSCULAR | Status: DC | PRN

## 2020-02-16 MED ORDER — POTASSIUM CHLORIDE CRYS ER 20 MEQ PO TBCR
40.0000 meq | EXTENDED_RELEASE_TABLET | Freq: Two times a day (BID) | ORAL | Status: AC
  Administered 2020-02-16 – 2020-02-17 (×4): 40 meq via ORAL
  Filled 2020-02-16 (×4): qty 2

## 2020-02-16 MED ORDER — SODIUM CHLORIDE 1 G PO TABS
1.0000 g | ORAL_TABLET | Freq: Three times a day (TID) | ORAL | Status: DC
  Administered 2020-02-16 – 2020-02-17 (×5): 1 g via ORAL
  Filled 2020-02-16 (×8): qty 1

## 2020-02-16 MED ORDER — NAPHAZOLINE-GLYCERIN 0.012-0.2 % OP SOLN
1.0000 [drp] | Freq: Four times a day (QID) | OPHTHALMIC | Status: DC | PRN
  Filled 2020-02-16: qty 15

## 2020-02-16 NOTE — Progress Notes (Signed)
Subjective: Patient reports no headaches, just some pain in left eye. Awake alert in bed  Objective: Vital signs in last 24 hours: Temp:  [97.7 F (36.5 C)-99.1 F (37.3 C)] 99.1 F (37.3 C) (09/30 0500) Pulse Rate:  [71-106] 71 (09/30 0500) Resp:  [10-18] 18 (09/30 0500) BP: (132-143)/(88-105) 140/88 (09/30 0500) SpO2:  [98 %-100 %] 100 % (09/30 0500)  Intake/Output from previous day: 09/29 0701 - 09/30 0700 In: 1028.9 [P.O.:360; I.V.:668.9] Out: 2500 [Urine:2500] Intake/Output this shift: No intake/output data recorded.  Neurologic: Grossly normal  Lab Results: Lab Results  Component Value Date   WBC 6.9 02/14/2020   HGB 15.5 02/14/2020   HCT 42.8 02/14/2020   MCV 88.6 02/14/2020   PLT 193 02/14/2020   Lab Results  Component Value Date   INR 0.9 02/13/2020   BMET Lab Results  Component Value Date   NA 124 (L) 02/15/2020   K 3.3 (L) 02/15/2020   CL 90 (L) 02/15/2020   CO2 23 02/15/2020   GLUCOSE 148 (H) 02/15/2020   BUN 7 02/15/2020   CREATININE 0.47 (L) 02/15/2020   CALCIUM 8.8 (L) 02/15/2020    Studies/Results: CT HEAD WO CONTRAST  Result Date: 02/14/2020 CLINICAL DATA:  Follow-up hemorrhage. EXAM: CT HEAD WITHOUT CONTRAST TECHNIQUE: Contiguous axial images were obtained from the base of the skull through the vertex without intravenous contrast. COMPARISON:  CT head 02/13/2020 FINDINGS: Brain: Interval increase in the size of intraparenchymal hemorrhages in the left frontal lobe. The larger intraparenchymal hemorrhage now measures 9 mm, previously 6 mm. The smaller more inferolateral focus of intraparenchymal hemorrhage now measures approximately 4 mm, previously punctate. Slight interval increase in surrounding edema without substantial mass effect. No midline shift. Similar small volume of subarachnoid hemorrhage along the medial aspect of the high right frontal lobe. Previous identified trace right frontal subarachnoid hemorrhage is no longer apparent. No  hydrocephalus. No evidence acute large vascular territory infarct. Similar patchy white matter hypoattenuation, nonspecific but most likely secondary to chronic microvascular ischemic disease. Vascular: Calcific atherosclerosis. Skull: Left frontal and periorbital scalp contusion with multiple small foreign bodies. Calvarium is intact. Sinuses/Orbits: Please see recent maxillofacial CT for characterization of multiple facial fractures. Similar associated hemosinus. Other: No mastoid effusions. IMPRESSION: 1. Interval increase in the size of intraparenchymal hemorrhages within the left frontal lobe, now measuring up to 9 mm and 4 mm as detailed above. Slight increase in surrounding edema without substantial mass effect. 2. Similar small volume of left frontal subarachnoid hemorrhage. 3. Please see recent maxillofacial CT for characterization of multiple facial fractures. Electronically Signed   By: Margaretha Sheffield MD   On: 02/14/2020 11:31    Assessment/Plan: Patient has improved quite a bit. He is awake alert and conversing now. Follow up head CT is stable. We will sign off for now. Please call us with any concerns. Follow up in the office in 2 weeks   LOS: 3 days    Ocie Cornfield Chae Shuster 02/16/2020, 8:11 AM

## 2020-02-16 NOTE — Progress Notes (Signed)
Inpatient Rehabilitation Admissions Coordinator  I met with patient at bedside and then spoke with his wife by phone. They have been separated for about 4 weeks. She will be discussing with him his final dispo but would like assistance form Case worker on getting patient into drug and alcohol rehab hopefully directly from hospitalization. Patient progressing well with therapies and given current progress, unlikely to need an inpt rehab admission. I have updated acute team and TOC.  Danne Baxter, RN, MSN Rehab Admissions Coordinator [phone] 02/16/2020 12:42 PM
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
## 2020-02-17 LAB — BASIC METABOLIC PANEL
Anion gap: 7 (ref 5–15)
BUN: 6 mg/dL (ref 6–20)
CO2: 24 mmol/L (ref 22–32)
Calcium: 8.9 mg/dL (ref 8.9–10.3)
Chloride: 102 mmol/L (ref 98–111)
Creatinine, Ser: 0.61 mg/dL (ref 0.61–1.24)
GFR calc Af Amer: >60 mL/min (ref >60)
GFR calc non Af Amer: >60 mL/min (ref >60)
Glucose, Bld: 91 mg/dL (ref 70–99)
Potassium: 3.8 mmol/L (ref 3.5–5.1)
Sodium: 133 mmol/L — ABNORMAL LOW (ref 135–145)

## 2020-02-17 LAB — MAGNESIUM: Magnesium: 2 mg/dL (ref 1.7–2.4)
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
## 2020-02-17 NOTE — Discharge Instructions (Signed)
Please take your keppra to completion Follow up with your primary care provider to discuss if you should continue to take your Xarelto  Traumatic Brain Injury Traumatic brain injury (TBI) is an injury to the brain that results from:  A hard, direct blow to the head (closed injury).  An object penetrating the skull and entering the brain (open injury). Traumatic brain injury is also called a head injury or a concussion. TBI can be mild, moderate, or severe. What are the causes? Common causes of this condition include:  Falls.  Motor vehicle accidents.  Sports injuries.  Assaults. What increases the risk? You are more likely to develop this condition if you:  Are 21 years old or older.  Are a man.  Play contact sports, especially football, hockey, or soccer.  Are in the [employer]  Are a victim of violence.  Abuse drugs or alcohol.  Have had a previous TBI. What are the signs or symptoms? Symptoms may vary from person to person, and may include:  Loss of consciousness.  Headache.  Confusion.  Fatigue.  Changes in sleep.  Dizziness.  Mood or personality changes.  Memory problems.  Nausea or vomiting or both.  Seizures.  Clumsiness.  Slurred speech.  Depression and anxiety.  Anger.  Trouble concentrating, organizing, or making decisions.  Inability to control emotions or actions (impulse control).  Loss of or dulling of the senses, such as hearing, vision, and touch. This can include: ? Blurred vision. ? Ringing in your ears. How is this diagnosed? This condition may be diagnosed based on:  Medical history and physical exam.  Neurologic exam. This checks for brain and nervous system function, including your reflexes, memory, and coordination.  CT scan. Your TBI may be described as mild, moderate, or severe. How is this treated? Treatment depends on the severity of your brain injury and may include:  Breathing support (mechanical  ventilation).  Blood pressure medicines.  Pain medicines.  Treatments to decrease the swelling in your brain.  Brain surgery. This may be needed to: ? Remove a blood clot. ? Repair bleeding. ? Remove an object that has penetrated the brain, such as a skull fragment or a bullet. Treatment of TBI also includes:  Physical and mental rest.  Careful observation.  Medicine. You may be prescribed medicines to help with symptoms such as headaches, nausea, or difficulty sleeping.  Physical, occupational, and speech therapy.  Referral to a concussion clinic or rehabilitation center. Follow these instructions at home: Medicines  Take over-the-counter and prescription medicines only as told by your health care provider.  Do not take blood thinners (anticoagulants), aspirin, or other anti-inflammatory medicines such as ibuprofen or naproxen unless approved by your health care provider. Activity  Rest. Rest helps the brain to heal. Make sure you: ? Get plenty of sleep. Most adults should get 7-9 hours of sleep each night. ? Rest during the day. Take daytime naps or rest breaks when you feel tired.  Do not do high-risk activities that could cause a second concussion, such as riding a bike or playing sports. Having another concussion before the first one has healed can be dangerous.  Avoid a lot of visual stimulation. This includes work on the computer or phone, watching TV, and reading.  Ask your health care provider what kind of activities are safe for you. Your ability to react may be slower after a brain injury. Never do these activities if you are dizzy. Your health care provider will likely give you  a plan for gradually returning to activities. General instructions  Do not drink alcohol.  Watch your symptoms and tell others to do the same. Complications sometimes occur after a brain injury. Older adults with a brain injury may have a higher risk of serious complications.  Seek  support from friends and family.  Keep all follow-up visits as directed by your health care provider. This is important. Contact a health care provider if:  Your symptoms get worse or they do not improve.  You have new symptoms.  You have another injury. Get help right away if:  You have: ? Severe, persistent headaches that are not relieved by medicine. ? Weakness or numbness in any part of your body. ? Confusion. ? Slurred speech. ? Difficulty waking up. ? Nausea or persistent vomiting. ? A feeling like you are moving when you are not (vertigo). ? Seizures or you faint. ? Changes in your vision. ? Clear or bloody discharge from your nose or ears.  You cannot use your arms or legs normally. Summary  Traumatic brain injury happens when there is a hard, direct blow to the head or when an object penetrates the skull and enters the brain.  Traumatic brain injuries may be mild, moderate, or severe. Treatment depends on the severity of your injury.  Get help right away if you have a head injury and you develop seizures, confusion, vomiting, weakness in the arms or legs, slurred speech, and other symptoms.  Rest is one of the best treatments. Do not return to activity until your health care provider approves. This information is not intended to replace advice given to you by your health care provider. Make sure you discuss any questions you have with your health care provider. Document Revised: 09/09/2017 Document Reviewed: 06/22/2017 Elsevier Patient Education  [place] No nose blowing and frequent saline nasal spray Please stay on a soft diet for the next 6 weeks  A soft-food eating plan includes foods that are safe and easy to chew and swallow. Your health care provider or dietitian can help you find foods and flavors that fit into this plan. Follow this plan until your health care provider or dietitian says it is safe to start eating other  foods and food textures. What are tips for following this plan? General guidelines   Take small bites of food, or cut food into pieces about  inch or smaller. Bite-sized pieces of food are easier to chew and swallow.  Eat moist foods. Avoid overly dry foods.  Avoid foods that: ? Are difficult to swallow, such as dry, chunky, crispy, or sticky foods. ? Are difficult to chew, such as hard, tough, or stringy foods. ? Contain nuts, seeds, or fruits.  Follow instructions from your dietitian about the types of liquids that are safe for you to swallow. You may be allowed to have: ? Thick liquids only. This includes only liquids that are thicker than honey. ? Thin and thick liquids. This includes all beverages and foods that become liquid at room temperature.  To make thick liquids: ? Purchase a commercial liquid thickening powder. These are available at grocery stores and pharmacies. ? Mix the thickener into liquids according to instructions on the label. ? Purchase ready-made thickened liquids. ? Thicken soup by pureeing, straining to remove chunks, and adding flour, potato flakes, or corn starch. ? Add commercial thickener to foods that become liquid at room temperature, such as milk shakes, yogurt, ice cream, gelatin,  and sherbet.  Ask your health care provider whether you need to take a fiber supplement. Cooking  Cook meats so they stay tender and moist. Use methods like braising, stewing, or baking in liquid.  Cook vegetables and fruit until they are soft enough to be mashed with a fork.  Peel soft, fresh fruits such as peaches, nectarines, and melons.  When making soup, make sure chunks of meat and vegetables are smaller than  inch.  Reheat leftover foods slowly so that a tough crust does not form. What foods are allowed? The items listed below may not be a complete list. Talk with your dietitian about what dietary choices are best for you. Grains Breads, muffins, pancakes,  or waffles moistened with syrup, jelly, or butter. Dry cereals well-moistened with milk. Moist, cooked cereals. Well-cooked pasta and rice. Vegetables All soft-cooked vegetables. Shredded lettuce. Fruits All canned and cooked fruits. Soft, peeled fresh fruits. Strawberries. Dairy Milk. Cream. Yogurt. Cottage cheese. Soft cheese without the rind. Meats and other protein foods Tender, moist ground meat, poultry, or fish. Meat cooked in gravy or sauces. Eggs. Sweets and desserts Ice cream. Milk shakes. Sherbet. Pudding. Fats and oils Butter. Margarine. Olive, canola, sunflower, and grapeseed oil. Smooth salad dressing. Smooth cream cheese. Mayonnaise. Gravy. What foods are not allowed? The items listed bemay not be a complete list. Talk with your dietitian about what dietary choices are best for you. Grains Coarse or dry cereals, such as bran, granola, and shredded wheat. Tough or chewy crusty breads, such as [place] bread or baguettes. Breads with nuts, seeds, or fruit. Vegetables All raw vegetables. Cooked corn. Cooked vegetables that are tough or stringy. Tough, crisp, fried potatoes and potato skins. Fruits Fresh fruits with skins or seeds, or both, such as apples, pears, and grapes. Stringy, high-pulp fruits, such as papaya, pineapple, coconut, and mango. Fruit leather and all dried fruit. Dairy Yogurt with nuts or coconut. Meats and other protein foods Hard, dry sausages. Dry meat, poultry, or fish. Meats with gristle. Fish with bones. Fried meat or fish. Lunch meat and hotdogs. Nuts and seeds. Chunky peanut butter or other nut butters. Sweets and desserts Cakes or cookies that are very dry or chewy. Desserts with dried fruit, nuts, or coconut. Fried pastries. Very rich pastries. Fats and oils Cream cheese with fruit or nuts. Salad dressings with seeds or chunks. Summary  A soft-food eating plan includes foods that are safe and easy to swallow. Generally, the foods should be soft  enough to be mashed with a fork.  Avoid foods that are dry, hard to chew, crunchy, sticky, stringy, or crispy.  Ask your health care provider whether you need to thicken your liquids and if you need to take a fiber supplement. This information is not intended to replace advice given to you by your health care provider. Make sure you discuss any questions you have with your health care provider. Document Revised: 08/26/2018 Document Reviewed: 07/08/2016 Elsevier Patient Education  [place].
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
## 2020-02-17 NOTE — Progress Notes (Addendum)
Inpatient Rehabilitation Admissions Coordinator  Therapy continues to feel patient would benefit from CIR admit. I will not have a bed available to pursue admit at CIR this week for this patient. I have notified acute team and TOC. I will follow from a distance.  Danne Baxter, RN, MSN Rehab Admissions Coordinator [phone] 02/17/2020 8:57 AM
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
## 2020-02-18 MED ORDER — OXYCODONE HCL 5 MG PO TABS
5.0000 mg | ORAL_TABLET | Freq: Four times a day (QID) | ORAL | 0 refills | Status: AC | PRN
Start: 2020-02-18 — End: 2020-02-23

## 2020-02-18 MED ORDER — RIVAROXABAN 20 MG PO TABS: 20.0000 mg | ORAL_TABLET | Freq: Every day | ORAL | 6 refills | Status: DC

## 2020-02-18 MED ORDER — ACETAMINOPHEN 500 MG PO TABS: 1000.0000 mg | ORAL_TABLET | Freq: Three times a day (TID) | ORAL | 0 refills | Status: DC | PRN

## 2020-02-18 MED ORDER — METHOCARBAMOL 500 MG PO TABS: 500.0000 mg | ORAL_TABLET | Freq: Four times a day (QID) | ORAL | 0 refills | Status: AC | PRN

## 2020-02-18 MED ORDER — DOCUSATE SODIUM 100 MG PO CAPS
100.0000 mg | ORAL_CAPSULE | Freq: Two times a day (BID) | ORAL | 0 refills | Status: DC
Start: 2020-02-18 — End: 2020-03-07

## 2020-02-18 MED ORDER — SALINE SPRAY 0.65 % NA SOLN: 1.0000 | Freq: Two times a day (BID) | NASAL | 0 refills | Status: DC

## 2020-02-18 MED ORDER — POLYETHYLENE GLYCOL 3350 17 G PO PACK: 17.0000 g | PACK | Freq: Every day | ORAL | 0 refills | Status: DC | PRN

## 2020-02-18 MED ORDER — LEVETIRACETAM 500 MG PO TABS: 500.0000 mg | ORAL_TABLET | Freq: Two times a day (BID) | ORAL | 0 refills | Status: DC

## 2020-02-18 MED ORDER — ONDANSETRON 4 MG PO TBDP: 4.0000 mg | ORAL_TABLET | Freq: Four times a day (QID) | ORAL | 0 refills | Status: DC | PRN

## 2020-02-18 NOTE — Progress Notes (Signed)
Inpatient Rehabilitation Admissions Coordinator  Noted therapy recommendations for outpatient. We will sign off at this time.  Danne Baxter, RN, MSN Rehab Admissions Coordinator [phone] 02/18/2020 10:19 AM
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
## 2020-02-23 ENCOUNTER — Telehealth: Payer: Self-pay | Admitting: Cardiology
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
## 2020-02-23 NOTE — Telephone Encounter (Signed)
He should not resume Xarelto until cleared by Neuro. Also high risk of bleeding with Cocaine/Etoh abuse.   Markevion Lattin Martinique MD, [facility]

## 2020-02-23 NOTE — Telephone Encounter (Signed)
Returned call to patient's mother Dr.Jordan's advice given.
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
## 2020-02-27 ENCOUNTER — Other Ambulatory Visit: Payer: Self-pay

## 2020-02-27 ENCOUNTER — Ambulatory Visit (INDEPENDENT_AMBULATORY_CARE_PROVIDER_SITE_OTHER): Payer: BC Managed Care – PPO | Admitting: Dermatology

## 2020-02-27 ENCOUNTER — Encounter: Payer: Self-pay | Admitting: Dermatology

## 2020-02-27 DIAGNOSIS — Z1283 Encounter for screening for malignant neoplasm of skin: Secondary | ICD-10-CM

## 2020-02-27 DIAGNOSIS — L409 Psoriasis, unspecified: Secondary | ICD-10-CM

## 2020-02-27 MED ORDER — OTEZLA 30 MG PO TABS
30.0000 mg | ORAL_TABLET | Freq: Two times a day (BID) | ORAL | 0 refills | Status: DC
Start: 2020-02-27 — End: 2020-04-16

## 2020-02-27 MED ORDER — CLOBETASOL PROPIONATE 0.05 % EX FOAM: CUTANEOUS | 6 refills | Status: DC

## 2020-02-27 MED ORDER — TRIAMCINOLONE ACETONIDE 0.1 % EX OINT: 1.0000 "application " | TOPICAL_OINTMENT | Freq: Two times a day (BID) | CUTANEOUS | 11 refills | Status: DC | PRN

## 2020-02-27 MED ORDER — ENSTILAR 0.005-0.064 % EX FOAM: 1.0000 "application " | Freq: Every day | CUTANEOUS | 6 refills | Status: DC

## 2020-02-27 NOTE — Progress Notes (Signed)
Orders sent  Clobetasol costso enstilar [facility] otezla [facility]
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
## 2020-03-01 ENCOUNTER — Other Ambulatory Visit: Payer: Self-pay | Admitting: Student

## 2020-03-01 DIAGNOSIS — S069X1A Unspecified intracranial injury with loss of consciousness of 30 minutes or less, initial encounter: Secondary | ICD-10-CM

## 2020-03-01 DIAGNOSIS — S069X9A Unspecified intracranial injury with loss of consciousness of unspecified duration, initial encounter: Secondary | ICD-10-CM
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
## 2020-03-03 NOTE — Procedures (Signed)
Patient Name: Juan [name], Juan [name] Date: 01/31/2020 Gender: Male D.O.B: 10-09-1965 Age (years): 54 Referring Provider: Peter M Martinique Height (inches): 71 Interpreting Physician: Shelva Majestic MD, ABSM Weight (lbs): 165 RPSGT: Laren Everts BMI: 23 MRN: [id number] Neck Size: 14.50  CLINICAL INFORMATION The patient is referred for a CPAP titration to treat sleep apnea.  Date of NPSG:  11/11/2019:  AHI 15.1/h; RDI 22.8/h; REM AHI 13.1/hr; supine AHI 23.0/h; 02 nadir 89%  SLEEP STUDY TECHNIQUE As per the AASM Manual for the Scoring of Sleep and Associated Events v2.3 (April 2016) with a hypopnea requiring 4% desaturations.  The channels recorded and monitored were frontal, central and occipital EEG, electrooculogram (EOG), submentalis EMG (chin), nasal and oral airflow, thoracic and abdominal wall motion, anterior tibialis EMG, snore microphone, electrocardiogram, and pulse oximetry. Continuous positive airway pressure (CPAP) was initiated at the beginning of the study and titrated to treat sleep-disordered breathing.  MEDICATIONS acetaminophen (TYLENOL) 500 MG tablet ALPRAZolam (XANAX) 1 MG tablet amLODipine (NORVASC) 5 MG tablet Apremilast (OTEZLA) 30 MG TABS Calcipotriene-Betameth Diprop (ENSTILAR) 0.005-0.064 % FOAM Calcipotriene-Betameth Diprop (ENSTILAR) 0.005-0.064 % FOAM clobetasol (OLUX) 0.05 % topical foam docusate sodium (COLACE) 100 MG capsule levETIRAcetam (KEPPRA) 500 MG tablet(Expired) lisinopril (PRINIVIL,ZESTRIL) 40 MG tablet metoprolol succinate (TOPROL-XL) 25 MG 24 hr tablet ondansetron (ZOFRAN-ODT) 4 MG disintegrating tablet polyethylene glycol (MIRALAX / GLYCOLAX) 17 g packet rivaroxaban (XARELTO) 20 MG TABS tablet sildenafil (REVATIO) 20 MG tablet sodium chloride (OCEAN) 0.65 % SOLN nasal spray(Expired) triamcinolone ointment (KENALOG) 0.1 % valACYclovir (VALTREX) 1000 MG tablet Medications self-administered by patient taken the night of the  study : N/A  TECHNICIAN COMMENTS Comments added by technician: Patient had difficulty initiating sleep. Patient was restless all through the night. Comments added by scorer: N/A  RESPIRATORY PARAMETERS Optimal PAP Pressure (cm): 11 AHI at Optimal Pressure (/hr): 0.0 Overall Minimal O2 (%): 95.0 Supine % at Optimal Pressure (%): 33 Minimal O2 at Optimal Pressure (%): 97.0   SLEEP ARCHITECTURE The study was initiated at 9:44:05 PM and ended at 4:52:12 AM.  Sleep onset time was 17.4 minutes and the sleep efficiency was 38.0%%. The total sleep time was 162.5 minutes.  The patient spent 40.9%% of the night in stage N1 sleep, 46.2%% in stage N2 sleep, 0.0%% in stage N3 and 12.9% in REM.Stage REM latency was 96.0 minutes  Wake after sleep onset was 248.3. Alpha intrusion was absent. Supine sleep was 59.38%.  CARDIAC DATA The 2 lead EKG demonstrated sinus rhythm. The mean heart rate was 61.3 beats per minute. Other EKG findings include: None.  LEG MOVEMENT DATA The total Periodic Limb Movements of Sleep (PLMS) were 0. The PLMS index was 0.0. A PLMS index of <15 is considered normal in adults.  IMPRESSIONS - CPAP was initiated at 5 cm and was titrated to optimal PAP pressure at 11 cm of water (AHI 0; RDI 10.5/h). - Central sleep apnea was not noted during this titration (CAI = 0.4/h). - Significant oxygen desaturations were not observed during this titration (min O2 95.0%). - No snoring was audible during this study. - No cardiac abnormalities were observed during this study. - Clinically significant periodic limb movements were not noted during this study. Arousals associated with PLMs were rare.  DIAGNOSIS - Obstructive Sleep Apnea (G47.33)  RECOMMENDATIONS - Recommend an initial trial of CPAP therapy with EPR at 11 cm H2O with heated humidification. A Medium size Resmed Full Face Mask AirFit F20 mask was used for the titration study. - Effort  should be made to otpimize nasal and  oropharyngeal patency. - Avoid alcohol, sedatives and other CNS depressants that may worsen sleep apnea and disrupt normal sleep architecture. - Sleep hygiene should be reviewed to assess factors that may improve sleep quality. - Weight management and regular exercise should be initiated or continued. - Recommend a download in 30 days and sleep Center evaluation after 4 weeks of therapy.   [Electronically signed] 03/03/2020 04:21 PM  Shelva Majestic MD, [facility], ABSM Diplomate, American Board of Sleep Medicine   NPI: [id number] [place] PH: [phone]   FX: [phone] [place]

## 2020-03-05 ENCOUNTER — Other Ambulatory Visit: Payer: Self-pay

## 2020-03-05 ENCOUNTER — Ambulatory Visit
Admission: RE | Admit: 2020-03-05 | Discharge: 2020-03-05 | Disposition: A | Discharge status: Home / Self Care | Payer: BC Managed Care – PPO | Source: Ambulatory Visit | Attending: Student | Admitting: Student

## 2020-03-05 DIAGNOSIS — S069X9A Unspecified intracranial injury with loss of consciousness of unspecified duration, initial encounter: Secondary | ICD-10-CM

## 2020-03-05 DIAGNOSIS — S069X1A Unspecified intracranial injury with loss of consciousness of 30 minutes or less, initial encounter: Secondary | ICD-10-CM

## 2020-03-07 ENCOUNTER — Other Ambulatory Visit: Payer: Self-pay

## 2020-03-07 ENCOUNTER — Ambulatory Visit (INDEPENDENT_AMBULATORY_CARE_PROVIDER_SITE_OTHER): Payer: BC Managed Care – PPO | Admitting: Cardiology

## 2020-03-07 ENCOUNTER — Telehealth: Payer: Self-pay | Admitting: *Deleted

## 2020-03-07 ENCOUNTER — Encounter: Payer: Self-pay | Admitting: Cardiology

## 2020-03-07 VITALS — BP 130/88 | HR 74 | Ht 71.0 in | Wt 159.0 lb

## 2020-03-07 DIAGNOSIS — I480 Paroxysmal atrial fibrillation: Secondary | ICD-10-CM | POA: Diagnosis not present

## 2020-03-07 DIAGNOSIS — G4733 Obstructive sleep apnea (adult) (pediatric): Secondary | ICD-10-CM

## 2020-03-07 DIAGNOSIS — F1010 Alcohol abuse, uncomplicated: Secondary | ICD-10-CM | POA: Diagnosis not present

## 2020-03-07 DIAGNOSIS — I10 Essential (primary) hypertension: Secondary | ICD-10-CM | POA: Diagnosis not present

## 2020-03-07 NOTE — Telephone Encounter (Signed)
-----   Message from Troy Sine, MD sent at 03/03/2020  4:27 PM EDT ----- [facility], Please notify pt and set up with DME for CPAP

## 2020-03-07 NOTE — Telephone Encounter (Signed)
Informed patient of sleep study results and patient understanding was verbalized. Patient understands her sleep study showed CPAP was initiated at 5 cm and was titrated to optimal PAP pressure at 11 cm of water (AHI 0; RDI 10.5/h). - Significant oxygen desaturations were not observed during this titration (min O2 95.0%).  DIAGNOSIS - Obstructive Sleep Apnea (G47.33)  RECOMMENDATIONS - Recommend an initial trial of CPAP therapy with EPR at 11 cm H2O with heated humidification.  Upon patient request DME selection is [facility]. Patient understands she/he will be contacted by [facility] to set up her/he cpap. Patient understands to call if [facility] does not contact her/he with new setup in a timely manner. Patient understands they will be called once confirmation has been received from [facility] that they have received their new machine to schedule 10 week follow up appointment.   [facility] notified of new cpap order  Please add to airview Patient was grateful for the call and thanked me.

## 2020-03-13 ENCOUNTER — Ambulatory Visit: Payer: BC Managed Care – PPO

## 2020-03-13 ENCOUNTER — Ambulatory Visit: Payer: BC Managed Care – PPO | Admitting: Occupational Therapy

## 2020-03-13 ENCOUNTER — Ambulatory Visit: Payer: BC Managed Care – PPO | Admitting: Physical Therapy

## 2020-03-15 ENCOUNTER — Encounter: Payer: Self-pay | Admitting: Speech Pathology

## 2020-03-15 ENCOUNTER — Ambulatory Visit: Payer: BC Managed Care – PPO | Attending: Internal Medicine | Admitting: Speech Pathology

## 2020-03-15 ENCOUNTER — Other Ambulatory Visit: Payer: Self-pay

## 2020-03-15 DIAGNOSIS — R41841 Cognitive communication deficit: Secondary | ICD-10-CM | POA: Diagnosis present

## 2020-03-15 DIAGNOSIS — S069X9S Unspecified intracranial injury with loss of consciousness of unspecified duration, sequela: Secondary | ICD-10-CM | POA: Diagnosis present
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
## 2020-03-20 ENCOUNTER — Other Ambulatory Visit: Payer: Self-pay

## 2020-03-20 ENCOUNTER — Ambulatory Visit: Payer: BC Managed Care – PPO | Attending: Internal Medicine | Admitting: Speech Pathology

## 2020-03-20 DIAGNOSIS — M6281 Muscle weakness (generalized): Secondary | ICD-10-CM | POA: Insufficient documentation

## 2020-03-20 DIAGNOSIS — Z9181 History of falling: Secondary | ICD-10-CM | POA: Insufficient documentation

## 2020-03-20 DIAGNOSIS — R278 Other lack of coordination: Secondary | ICD-10-CM | POA: Insufficient documentation

## 2020-03-20 DIAGNOSIS — R41842 Visuospatial deficit: Secondary | ICD-10-CM | POA: Diagnosis present

## 2020-03-20 DIAGNOSIS — R41841 Cognitive communication deficit: Secondary | ICD-10-CM | POA: Insufficient documentation

## 2020-03-20 DIAGNOSIS — R41844 Frontal lobe and executive function deficit: Secondary | ICD-10-CM | POA: Insufficient documentation

## 2020-03-20 DIAGNOSIS — R41840 Attention and concentration deficit: Secondary | ICD-10-CM | POA: Diagnosis present

## 2020-03-20 DIAGNOSIS — S069X9S Unspecified intracranial injury with loss of consciousness of unspecified duration, sequela: Secondary | ICD-10-CM | POA: Insufficient documentation

## 2020-03-20 DIAGNOSIS — R2689 Other abnormalities of gait and mobility: Secondary | ICD-10-CM | POA: Diagnosis present

## 2020-03-20 DIAGNOSIS — R42 Dizziness and giddiness: Secondary | ICD-10-CM | POA: Insufficient documentation

## 2020-03-20 DIAGNOSIS — R2681 Unsteadiness on feet: Secondary | ICD-10-CM | POA: Diagnosis present

## 2020-03-20 NOTE — Patient Instructions (Signed)
Take your Office Clean Out list and your notebook to your office, revise and rewrite your list in your notebook. Remember to break down larger tasks into steps, check things off as you go. Try to focus on one thing at a time; it might help you to set a timer.  Consider using your notebook to keep a list of tasks/ notes for things like your disability application.  As far as your sleeping schedule goes, see if you can take short mental breaks to prevent yourself from becoming over-fatigued to the point that you need to sleep. Try your best to follow a regular schedule and avoid sleeping too much during the day, which may be disrupting your sleep at night.
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
## 2020-03-23 ENCOUNTER — Ambulatory Visit: Payer: BC Managed Care – PPO

## 2020-03-23 ENCOUNTER — Encounter: Payer: Self-pay | Admitting: Occupational Therapy

## 2020-03-23 ENCOUNTER — Other Ambulatory Visit: Payer: Self-pay

## 2020-03-23 ENCOUNTER — Ambulatory Visit: Payer: BC Managed Care – PPO | Admitting: Occupational Therapy

## 2020-03-23 DIAGNOSIS — R41840 Attention and concentration deficit: Secondary | ICD-10-CM

## 2020-03-23 DIAGNOSIS — R41841 Cognitive communication deficit: Secondary | ICD-10-CM

## 2020-03-23 DIAGNOSIS — R2689 Other abnormalities of gait and mobility: Secondary | ICD-10-CM

## 2020-03-23 DIAGNOSIS — R41842 Visuospatial deficit: Secondary | ICD-10-CM

## 2020-03-23 DIAGNOSIS — R278 Other lack of coordination: Secondary | ICD-10-CM

## 2020-03-23 DIAGNOSIS — R2681 Unsteadiness on feet: Secondary | ICD-10-CM

## 2020-03-23 DIAGNOSIS — M6281 Muscle weakness (generalized): Secondary | ICD-10-CM

## 2020-03-23 DIAGNOSIS — R41844 Frontal lobe and executive function deficit: Secondary | ICD-10-CM
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
## 2020-03-26 ENCOUNTER — Encounter: Payer: Self-pay | Admitting: Physical Therapy

## 2020-03-26 ENCOUNTER — Encounter: Payer: Self-pay | Admitting: Speech Pathology

## 2020-03-26 ENCOUNTER — Other Ambulatory Visit: Payer: Self-pay

## 2020-03-26 ENCOUNTER — Ambulatory Visit: Payer: BC Managed Care – PPO | Admitting: Physical Therapy

## 2020-03-26 ENCOUNTER — Ambulatory Visit: Payer: BC Managed Care – PPO

## 2020-03-26 DIAGNOSIS — M6281 Muscle weakness (generalized): Secondary | ICD-10-CM

## 2020-03-26 DIAGNOSIS — R2681 Unsteadiness on feet: Secondary | ICD-10-CM

## 2020-03-26 DIAGNOSIS — Z9181 History of falling: Secondary | ICD-10-CM

## 2020-03-26 DIAGNOSIS — R41841 Cognitive communication deficit: Secondary | ICD-10-CM

## 2020-03-26 DIAGNOSIS — R42 Dizziness and giddiness: Secondary | ICD-10-CM

## 2020-03-26 DIAGNOSIS — R2689 Other abnormalities of gait and mobility: Secondary | ICD-10-CM

## 2020-03-26 NOTE — Therapy (Signed)
[facility] [address] [place], [place], [address] Phone: [phone]   Fax:  [phone]  Speech Language Pathology Treatment  Patient Details  Name: Juan [name] MRN: [id number] Date of Birth: 01/04/1966 Referring Provider (SLP): Rodolph Bong, [place], PA-C (referring)/Roberts, Jori Moll, MD (PCP)   Encounter Date: 03/26/2020   End of Session - 03/26/20 1103    Visit Number 4    Number of Visits 17    Date for SLP Re-Evaluation 05/15/20    SLP Start Time 0850    SLP Stop Time  0931    SLP Time Calculation (min) 41 min    Activity Tolerance Patient tolerated treatment well           Past Medical History:  Diagnosis Date  . Alcohol abuse   . Anxiety   . Atypical nevus 02/06/2015   Right Back - Mild  . Atypical nevus 04/30/2016   Right Chest - Mild  . Atypical nevus 04/30/2016   Right Shoulder - Mild  . Chronic back pain   . Drug abuse ([place])   . Hypertension   . Lumbar disc disease   . Psoriasis     Past Surgical History:  Procedure Laterality Date  . APPENDECTOMY    . SHOULDER SURGERY Left     There were no vitals filed for this visit.   Subjective Assessment - 03/26/20 0858    Subjective "So did you go to [employer]?"    Currently in Pain? No/denies                 ADULT SLP TREATMENT - 03/26/20 0859      General Information   Behavior/Cognition Alert;Cooperative;Pleasant mood      Treatment Provided   Treatment provided Cognitive-Linquistic      Cognitive-Linquistic Treatment   Treatment focused on Cognition    Skilled Treatment Pt made a journal for each day over the weekend - stated it helped keep him on track better than without it. Pt stated he feels that he is able to get more easily distracted now than prior to hospitalization. Pt recalled that he and other SLP Camie Patience) discussed possible attention meds with his PCP and pt to address this with PCP later today. Pt to  talk about this medication and about driving with PCP - stated that when he d/c'd that MD told him to drive during the day only. Homework is to set a date and time for him to talk with wife about health insurance and talk with PCP about an attention med.      Assessment / Recommendations / Plan   Plan Continue with current plan of care      Progression Toward Goals   Progression toward goals Progressing toward goals            SLP Education - 03/26/20 1100    Education Details low dose attention med may assist pt            SLP Short Term Goals - 03/26/20 1103      SLP SHORT TERM GOAL #1   Title Pt will use external aids/alerts to take PM medications 5/7 days.    Time 3    Period Weeks    Status On-going      SLP SHORT TERM GOAL #2   Title Pt will establish system for organization/recall and bring to 3 therapy sessions.    Baseline 03-26-20    Time 3    Period  Weeks    Status On-going      SLP SHORT TERM GOAL #3   Title Pt will demo appropriate use of compensations for attention, processing to recall >80% of details from mod complex paragraph (written or verbal presentation), x 3 sessions.    Time 3    Period Weeks    Status On-going            SLP Long Term Goals - 03/26/20 1104      SLP LONG TERM GOAL #1   Title Pt will report no missed medications over 2 consecutive visits with use of external aids/alerts.    Time 7    Period Weeks    Status On-going      SLP LONG TERM GOAL #2   Title Pt will demonstrate appropriate judgment, anticipatory awareness of tasks to avoid and accomodations necessary for workplace safety.    Time 7    Period Weeks    Status On-going      SLP LONG TERM GOAL #3   Title Pt will use organization/recall system to manage appointments, finances and to dos independently over 3 sessions.    Time 7    Period Weeks    Status On-going      SLP LONG TERM GOAL #4   Title Pt will demo divided attention between 2 personally relevant tasks  for 10 minutes maintaining >85% accuracy.    Time 7    Period Weeks    Status On-going            Plan - 03/26/20 1103    Clinical Impression Statement Mr. 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 Since his brain injury, he reports increased difficulty concentrating, following complex instructions, planning and keeping appointments, losing items, remembering names, and making simple mistakes. 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 PT and OT evals scheduled. I recommend skilled ST to maximize pt's cognitive abilities to ensure safety and improve ability to fulfill duties in the workplace and at home.    Speech Therapy Frequency 2x / week    Duration --   8 weeks or 17 visits   Treatment/Interventions Environmental controls;Language facilitation;Cueing hierarchy;SLP instruction and feedback;Cognitive reorganization;Compensatory techniques;Functional tasks;Compensatory strategies;Internal/external aids;Patient/family education    Potential to Achieve Goals Good    Consulted and Agree with Plan of Care Patient           Patient will benefit from skilled therapeutic intervention in order to improve the following deficits and impairments:   Cognitive communication deficit    Problem List Patient Active Problem List   Diagnosis Date Noted  . Intracranial hemorrhage ([place])   . Multiple closed facial bone fractures ([place])   . Trauma   . Multiple trauma   . Traumatic brain injury with loss of consciousness ([place])   . Essential hypertension   . DDD (degenerative disc disease), lumbosacral   . Polysubstance abuse ([place])   . SAH (subarachnoid hemorrhage) ([place]) 02/13/2020  . OSA (obstructive sleep apnea) 01/31/2020    [facility] ,MS, CCC-SLP  03/26/2020, 11:05 AM  [facility] [address] [place], [place], [address] Phone: [phone]   Fax:  [phone]   Name: Juan [name] MRN: [id number] Date of Birth: 1965/06/23
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
## 2020-03-28 ENCOUNTER — Other Ambulatory Visit: Payer: Self-pay

## 2020-03-28 ENCOUNTER — Ambulatory Visit: Payer: BC Managed Care – PPO | Admitting: Speech Pathology

## 2020-03-28 ENCOUNTER — Encounter: Payer: Self-pay | Admitting: Speech Pathology

## 2020-03-28 ENCOUNTER — Ambulatory Visit: Payer: BC Managed Care – PPO | Admitting: Physical Therapy

## 2020-03-28 DIAGNOSIS — R41841 Cognitive communication deficit: Secondary | ICD-10-CM

## 2020-03-28 DIAGNOSIS — Z9181 History of falling: Secondary | ICD-10-CM

## 2020-03-28 DIAGNOSIS — M6281 Muscle weakness (generalized): Secondary | ICD-10-CM

## 2020-03-28 DIAGNOSIS — R2689 Other abnormalities of gait and mobility: Secondary | ICD-10-CM

## 2020-03-28 DIAGNOSIS — R2681 Unsteadiness on feet: Secondary | ICD-10-CM

## 2020-03-28 NOTE — Patient Instructions (Signed)
Use the note section of your phone for lists as well as make a daily schedule  Get the persons attention before you speak  Use eye contact and face the person you are speaking to  Be in close proximity to the person you are speaking to  Turn down any noise in the environment such as the TV, walk away from loud appliances, air conditioners, fans, dish washers etc  Repeat back what you have heard to make sure you got all of the information     Tips to help facilitate better attention, concentration, focus   Do harder, longer tasks when you are most alert/awake  Break down larger tasks into small parts  Limit distractions of TV, radio, conversation, e mails/texts, appliance noise, etc - if a job is important, do it in a quiet room  Be aware of how you are functioning in high stimulation environments such as large stores, parties, restaurants - any place with lots of lights, noise, signs etc  Group conversations may be more difficult to process than one on one conversations  Give yourself extra time to process conversation, reading materials, directions or information from your healthcare providers  Organization is key - clutters of laundry, mail, paperwork, dirty dishes - all make it more difficult to concentrate  Before you start a task, have all the needed supplies, directions, recipes ready and organized. This way you don't have to go looking for something in the middle of a task and become distracted.   Be aware of fatigue - take rests or breaks when needed to re-group and re-focus  Brain fog is real - take breaks in quiet rooms in between tasks to let your brain re-set  You look good and the same - people may forget that you are healing from a brain injury and loose patience or not believe that you forgot something - you may have to remind others that you have a brain injury and need help remembering and paying attention

## 2020-03-28 NOTE — Patient Instructions (Signed)
Access Code: [email or web address] URL: [email or web address] Date: 03/28/2020 Prepared by: Janann August  Exercises Tandem Walking with Counter Support - 2 x daily - 5 x weekly - 3 sets Single Leg Stance on Foam Pad - 1 x daily - 5 x weekly - 3 sets - 15 hold Romberg Stance with Head Nods on Foam Pad - 1 x daily - 5 x weekly - 2 sets - 10 reps Romberg Stance on Foam Pad with Head Rotation - 1 x daily - 5 x weekly - 2 sets - 10 reps Standing Marching - 2 x daily - 5 x weekly - 3 sets

## 2020-03-28 NOTE — Therapy (Signed)
[facility] [address] [place], [place], [address] Phone: [phone]   Fax:  [phone]  Physical Therapy Treatment  Patient Details  Name: Juan [name] MRN: [id number] Date of Birth: [birth year] Referring Provider (PT): Jillyn Ledger, [place]   Encounter Date: 03/28/2020   PT End of Session - 03/28/20 0900    Visit Number 2    Number of Visits 13    Date for PT Re-Evaluation 05/25/20    Authorization Type BCBS - 30 VL for PT and OT, each counts as 1 visit    Authorization - Visit Number 2    Authorization - Number of Visits 30    PT Start Time 0806    PT Stop Time 0846    PT Time Calculation (min) 40 min    Activity Tolerance Patient tolerated treatment well    Behavior During Therapy [facility] for tasks assessed/performed           Past Medical History:  Diagnosis Date  . Alcohol abuse   . Anxiety   . Atypical nevus 02/06/2015   Right Back - Mild  . Atypical nevus 04/30/2016   Right Chest - Mild  . Atypical nevus 04/30/2016   Right Shoulder - Mild  . Chronic back pain   . Drug abuse ([place])   . Hypertension   . Lumbar disc disease   . Psoriasis     Past Surgical History:  Procedure Laterality Date  . APPENDECTOMY    . SHOULDER SURGERY Left     There were no vitals filed for this visit.   Subjective Assessment - 03/28/20 0807    Subjective Doing well, no falls. Sometimes feels a little unsteady when first getting out of bed.    Pertinent History PMHx includes afib, HTN, anxiety, hx drug and alcohol abuse.    Limitations Standing;Walking    Patient Stated Goals improve balance    Currently in Pain? No/denies              [facility] PT Assessment - 03/28/20 0808      Strength   Right Hip Extension 4/5    Right Hip ABduction 5/5    Left Hip Extension 4/5    Left Hip ABduction 4+/5      6 Minute Walk- Baseline   6 Minute Walk- Baseline yes    BP (mmHg) 135/86    HR (bpm) 75    02 Sat  (%RA) 99 %    Modified Borg Scale for Dyspnea 0- Nothing at all      6 Minute walk- Post Test   6 Minute Walk Post Test yes    BP (mmHg) 133/86    HR (bpm) 78    02 Sat (%RA) 99 %    Modified Borg Scale for Dyspnea 2- Mild shortness of breath    Perceived Rate of Exertion (Borg) 11- Fairly light      6 minute walk test results    Aerobic Endurance Distance Walked 1135   ft   Endurance additional comments pt reporting feeling a little more off balanced towards the end when more fatigued                           Access Code: [email or web address] URL: [email or web address] Date: 03/28/2020 Prepared by: Janann August  Initiated HEP: See Medbridge for more details.   Exercises Tandem Walking with Counter Support - 2 x daily - 5 x weekly -  3 sets -forwards and backwards Single Leg Stance on Foam Pad - 1 x daily - 5 x weekly - 3 sets - 15 hold -next to counter Romberg Stance with Head Nods on Foam Pad - 1 x daily - 5 x weekly - 2 sets - 10 reps Romberg Stance on Foam Pad with Head Rotation - 1 x daily - 5 x weekly - 2 sets - 10 reps Standing Marching - 2 x daily - 5 x weekly - 3 sets -with use of greentband for resistance        PT Education - 03/28/20 0900    Education Details initial HEP    Person(s) Educated Patient    Methods Explanation;Demonstration;Handout    Comprehension Verbalized understanding;Returned demonstration            PT Short Term Goals - 03/28/20 0903      PT SHORT TERM GOAL #1   Title Pt will be independent with initial HEP in order to build upon functional gains made in therapy. ALL STGS DUE 04/16/20    Time 3    Period Weeks    Status New    Target Date 04/16/20      PT SHORT TERM GOAL #2   Title Perform 6MWT with LTG to be written as appropriate.    Baseline 1,135' on 03/28/20    Time 3    Period Weeks    Status Achieved      PT SHORT TERM GOAL #3   Title Pt will decr 5x sit <> stand time to 17 seconds or less with  no UE support in order to demo improved functional BLE strength.    Baseline 21.38 seconds    Time 3    Period Weeks      PT SHORT TERM GOAL #4   Title Pt will improve FGA score to at least a 22/30 in order to demo decr fall risk.    Baseline 19/30    Time 3    Period Weeks    Status New             PT Long Term Goals - 03/28/20 1610      PT LONG TERM GOAL #1   Title Pt will be independent with final HEP in order to build upon functional gains made in therapy. ALL LTGS DUE 05/07/20    Time 6    Period Weeks    Status New      PT LONG TERM GOAL #2   Title Pt will improve 6MWT by 150' to demo improved walking endurance.    Baseline 1,135' on 03/28/20    Time 6    Period Weeks    Status Revised      PT LONG TERM GOAL #3   Title Pt will improve FGA score to at least a 22/30 in order to demo decr fall risk.    Baseline 25/30    Time 6    Period Weeks    Status New      PT LONG TERM GOAL #4   Title Pt will ambulate 1,000' over outdoor unlevel surfaces with supervision in order to demo improved community mobility.    Time 6    Period Weeks    Status New      PT LONG TERM GOAL #5   Title Pt will improve gait speed to at least 3.5 ft/sec for improved gait efficiency.    Baseline 3.05 ft/sec    Time 6  Period Weeks    Status New                 Plan - 03/28/20 1025    Clinical Impression Statement Performed the 6MWT today with pt ambulating 1,135' with stable vital signs. At end, pt reporting feeling more fatigued and began to feel a little more off balanced. LTG written as appropriate. Remainder of session focused on initiating HEP for standing balance with SLS, compliant surfaces, tandem stance. Will continue to progress towards LTGs.    Personal Factors and Comorbidities Comorbidity 3+;Past/Current Experience;Profession    Comorbidities afib, HTN, anxiety, hx drug and alcohol abuse.    Examination-Activity Limitations Locomotion Level;Stairs;Transfers     Examination-Participation Restrictions Community Activity;Occupation    Stability/Clinical Decision Making Stable/Uncomplicated    Rehab Potential Good    PT Frequency 2x / week    PT Duration 6 weeks    PT Treatment/Interventions ADLs/Self Care Home Management;Gait training;Stair training;Therapeutic activities;Neuromuscular re-education;Balance training;Therapeutic exercise;Patient/family education;Vestibular    PT Next Visit Plan how is HEP? hip extensor strengthening.  high level balance with vestibular input, tandem, SLS.    Consulted and Agree with Plan of Care Patient           Patient will benefit from skilled therapeutic intervention in order to improve the following deficits and impairments:  Abnormal gait, Decreased activity tolerance, Decreased balance, Decreased endurance, Decreased strength, Difficulty walking, Dizziness  Visit Diagnosis: Unsteadiness on feet  Muscle weakness (generalized)  Other abnormalities of gait and mobility  History of falling     Problem List Patient Active Problem List   Diagnosis Date Noted  . Intracranial hemorrhage ([place])   . Multiple closed facial bone fractures ([place])   . Trauma   . Multiple trauma   . Traumatic brain injury with loss of consciousness ([place])   . Essential hypertension   . DDD (degenerative disc disease), lumbosacral   . Polysubstance abuse ([place])   . SAH (subarachnoid hemorrhage) ([place]) 02/13/2020  . OSA (obstructive sleep apnea) 01/31/2020    Arliss Journey, PT, DPT  03/28/2020, 10:25 AM  [facility] [address] [place], [place], [address] Phone: [phone]   Fax:  [phone]  Name: CURTIES CONIGLIARO MRN: [id number] Date of Birth: November 01, 1965

## 2020-03-28 NOTE — Therapy (Signed)
[facility] [address] [place], [place], [address] Phone: [phone]   Fax:  [phone]  Speech Language Pathology Treatment  Patient Details  Name: Juan [name] MRN: [id number] Date of Birth: [birth year] Referring Provider (SLP): Rodolph Bong, Barth Kirks, PA-C (referring)/Roberts, Jori Moll, MD (PCP)   Encounter Date: 03/28/2020   End of Session - 03/28/20 1002    Visit Number 5    Number of Visits 17    Date for SLP Re-Evaluation 05/15/20    SLP Start Time 0848    SLP Stop Time  0932    SLP Time Calculation (min) 44 min    Activity Tolerance Patient tolerated treatment well           Past Medical History:  Diagnosis Date  . Alcohol abuse   . Anxiety   . Atypical nevus 02/06/2015   Right Back - Mild  . Atypical nevus 04/30/2016   Right Chest - Mild  . Atypical nevus 04/30/2016   Right Shoulder - Mild  . Chronic back pain   . Drug abuse ([place])   . Hypertension   . Lumbar disc disease   . Psoriasis     Past Surgical History:  Procedure Laterality Date  . APPENDECTOMY    . SHOULDER SURGERY Left     There were no vitals filed for this visit.   Subjective Assessment - 03/28/20 0847    Subjective "We did sit down and talk about it" re: insurance    Currently in Pain? No/denies                 ADULT SLP TREATMENT - 03/28/20 0851      General Information   Behavior/Cognition Alert;Cooperative;Pleasant mood      Treatment Provided   Treatment provided Cognitive-Linquistic      Cognitive-Linquistic Treatment   Treatment focused on Cognition;Patient/family/caregiver education    Skilled Treatment Pt enters room with PT and ST papers loose and disorganized. Provided pt with folder and min A to organize PT, daily schedules, therapy calendar,, and ST notes. He has not filled out daily schedule. He reports he has had to go to the store multiple times due to forgetting items. He generated strategy of  using the note section of his phone as well as written daily schedule. He is improved taking pm meds by leaving bottle at his bedside. He has not yet obtained pill organizer, however is recalling meds in am without difficulty and reports he has forgot no meds in past week.. Pt is going to [facility] after ST. With min cues, he wrote list in note section of his phone for the 3 items he needs to get.       Assessment / Recommendations / Plan   Plan Continue with current plan of care      Progression Toward Goals   Progression toward goals Progressing toward goals            SLP Education - 03/28/20 1000    Education Details compensations for attention, memory and processing    Person(s) Educated Patient    Methods Explanation;Demonstration;Verbal cues;Handout    Comprehension Verbalized understanding;Returned demonstration;Verbal cues required;Need further instruction            SLP Short Term Goals - 03/28/20 1002      SLP SHORT TERM GOAL #1   Title Pt will use external aids/alerts to take PM medications 5/7 days.    Baseline 03/28/20    Time 3  [address] [place], [place], [address] Phone: [phone]   Fax:  [phone]   Name: Juan [name] MRN: [id number] Date of Birth: [birth year]  [address] [place], [place], [address] Phone: [phone]   Fax:  [phone]   Name: Juan [name] MRN: [id number] Date of Birth: [birth year]

## 2020-03-29 ENCOUNTER — Ambulatory Visit: Payer: BC Managed Care – PPO

## 2020-04-02 ENCOUNTER — Other Ambulatory Visit: Payer: Self-pay

## 2020-04-02 ENCOUNTER — Ambulatory Visit: Payer: BC Managed Care – PPO | Admitting: Physical Therapy

## 2020-04-02 ENCOUNTER — Ambulatory Visit: Payer: BC Managed Care – PPO

## 2020-04-02 ENCOUNTER — Encounter: Payer: Self-pay | Admitting: Physical Therapy

## 2020-04-02 DIAGNOSIS — R2689 Other abnormalities of gait and mobility: Secondary | ICD-10-CM

## 2020-04-02 DIAGNOSIS — R41841 Cognitive communication deficit: Secondary | ICD-10-CM

## 2020-04-02 DIAGNOSIS — Z9181 History of falling: Secondary | ICD-10-CM

## 2020-04-02 DIAGNOSIS — M6281 Muscle weakness (generalized): Secondary | ICD-10-CM

## 2020-04-02 DIAGNOSIS — R2681 Unsteadiness on feet: Secondary | ICD-10-CM

## 2020-04-02 NOTE — Therapy (Signed)
[facility] [address] [place], [place], [address] Phone: [phone]   Fax:  [phone]  Physical Therapy Treatment  Patient Details  Name: Juan [name] MRN: [id number] Date of Birth: [birth year] Referring Provider (PT): Jillyn Ledger, [place]   Encounter Date: 04/02/2020   PT End of Session - 04/02/20 1156    Visit Number 3    Number of Visits 13    Date for PT Re-Evaluation 05/25/20    Authorization Type BCBS - 30 VL for PT and OT, each counts as 1 visit    Authorization - Visit Number 3    Authorization - Number of Visits 30    PT Start Time 0809   PT LATE   PT Stop Time 0845    PT Time Calculation (min) 36 min    Equipment Utilized During Treatment Gait belt    Activity Tolerance Patient tolerated treatment well    Behavior During Therapy WFL for tasks assessed/performed           Past Medical History:  Diagnosis Date  . Alcohol abuse   . Anxiety   . Atypical nevus 02/06/2015   Right Back - Mild  . Atypical nevus 04/30/2016   Right Chest - Mild  . Atypical nevus 04/30/2016   Right Shoulder - Mild  . Chronic back pain   . Drug abuse ([place])   . Hypertension   . Lumbar disc disease   . Psoriasis     Past Surgical History:  Procedure Laterality Date  . APPENDECTOMY    . SHOULDER SURGERY Left     There were no vitals filed for this visit.   Subjective Assessment - 04/02/20 0811    Subjective No changes, no falls. Did his exercises over the weekend. Seeing the ophthamologist in beginning of december - still being bothered by bright lights.    Pertinent History PMHx includes afib, HTN, anxiety, hx drug and alcohol abuse.    Limitations Standing;Walking    Patient Stated Goals improve balance    Currently in Pain? No/denies                                  Balance Exercises - 04/02/20 0001      Balance Exercises: Standing   Standing Eyes Closed Narrow base of  support (BOS);Foam/compliant surface;Limitations    Standing Eyes Closed Limitations 2 x 30 seconds, narrow BOS 2 x 10 reps head turns, 2 x 10 reps head nods    Tandem Stance Eyes open;Intermittent upper extremity support;Foam/compliant surface;Limitations    Tandem Stance Time standing with LLE posteriorly, bringing R foot forwards and back on blue foam beam  - x10 reps intermittent touch to countertop for balance    SLS with Vectors Foam/compliant surface;Limitations    SLS with Vectors Limitations  alternating forward cone tap x8 reps B and cross body cone tap on air ex x5 reps B then progressed to adding cognitive challenge with pt taking incr time and needing chair at times for balance/ min guard    Tandem Gait Forward;Foam/compliant surface;Intermittent upper extremity support;Limitations    Tandem Gait Limitations forward tandem on blue foam beam down and back 2 reps with cues for slowed and controlled, then an additional 3 reps down and back with added cognitive challenge asking pt to name foods in alphabetical order a-z , pt taking incr time, needing intermittent touch to wall for balance  Other Standing Exercises on air ex: modified tandem stance eyes open x30 seconds B, eyes open x10 reps head turns B - intermittent touch to wall for balance, eyes closed x30 seconds B with intermittent touch to wall for balance               PT Short Term Goals - 03/28/20 0903      PT SHORT TERM GOAL #1   Title Pt will be independent with initial HEP in order to build upon functional gains made in therapy. ALL STGS DUE 04/16/20    Time 3    Period Weeks    Status New    Target Date 04/16/20      PT SHORT TERM GOAL #2   Title Perform 6MWT with LTG to be written as appropriate.    Baseline 1,135' on 03/28/20    Time 3    Period Weeks    Status Achieved      PT SHORT TERM GOAL #3   Title Pt will decr 5x sit <> stand time to 17 seconds or less with no UE support in order to demo improved  functional BLE strength.    Baseline 21.38 seconds    Time 3    Period Weeks      PT SHORT TERM GOAL #4   Title Pt will improve FGA score to at least a 22/30 in order to demo decr fall risk.    Baseline 19/30    Time 3    Period Weeks    Status New             PT Long Term Goals - 03/28/20 3825      PT LONG TERM GOAL #1   Title Pt will be independent with final HEP in order to build upon functional gains made in therapy. ALL LTGS DUE 05/07/20    Time 6    Period Weeks    Status New      PT LONG TERM GOAL #2   Title Pt will improve 6MWT by 150' to demo improved walking endurance.    Baseline 1,135' on 03/28/20    Time 6    Period Weeks    Status Revised      PT LONG TERM GOAL #3   Title Pt will improve FGA score to at least a 22/30 in order to demo decr fall risk.    Baseline 25/30    Time 6    Period Weeks    Status New      PT LONG TERM GOAL #4   Title Pt will ambulate 1,000' over outdoor unlevel surfaces with supervision in order to demo improved community mobility.    Time 6    Period Weeks    Status New      PT LONG TERM GOAL #5   Title Pt will improve gait speed to at least 3.5 ft/sec for improved gait efficiency.    Baseline 3.05 ft/sec    Time 6    Period Weeks    Status New                 Plan - 04/02/20 1159    Clinical Impression Statement Focus of today's skilled session was balance strategies on compliant surfaces with narrow BOS, vision removed and SLS. Pt most challenged when adding cognitive dual tasking - taking incr time or needing UE support for balance. Will continue to progress towards LTGs.    Personal Factors and Comorbidities Comorbidity 3+;Past/Current Experience;Profession  Comorbidities afib, HTN, anxiety, hx drug and alcohol abuse.    Examination-Activity Limitations Locomotion Level;Stairs;Transfers    Examination-Participation Restrictions Community Activity;Occupation    Stability/Clinical Decision Making  Stable/Uncomplicated    Rehab Potential Good    PT Frequency 2x / week    PT Duration 6 weeks    PT Treatment/Interventions ADLs/Self Care Home Management;Gait training;Stair training;Therapeutic activities;Neuromuscular re-education;Balance training;Therapeutic exercise;Patient/family education;Vestibular    PT Next Visit Plan cog dual tasking with gait/balance. hip extensor strengthening.  high level balance with vestibular input, tandem, SLS.    Consulted and Agree with Plan of Care Patient           Patient will benefit from skilled therapeutic intervention in order to improve the following deficits and impairments:  Abnormal gait, Decreased activity tolerance, Decreased balance, Decreased endurance, Decreased strength, Difficulty walking, Dizziness  Visit Diagnosis: Unsteadiness on feet  Muscle weakness (generalized)  Other abnormalities of gait and mobility  History of falling     Problem List Patient Active Problem List   Diagnosis Date Noted  . Intracranial hemorrhage ([place])   . Multiple closed facial bone fractures ([place])   . Trauma   . Multiple trauma   . Traumatic brain injury with loss of consciousness ([place])   . Essential hypertension   . DDD (degenerative disc disease), lumbosacral   . Polysubstance abuse ([place])   . SAH (subarachnoid hemorrhage) ([place]) 02/13/2020  . OSA (obstructive sleep apnea) 01/31/2020    Arliss Journey, PT, DPT 04/02/2020, 12:00 PM  [facility] [address] [place], [place], [address] Phone: [phone]   Fax:  [phone]  Name: Juan [name] MRN: [id number] Date of Birth: 17-Nov-1965

## 2020-04-02 NOTE — Therapy (Signed)
[facility] [address] [place], [place], [address] Phone: [phone]   Fax:  [phone]  Speech Language Pathology Treatment  Patient Details  Name: Juan [name] MRN: [id number] Date of Birth: 02/26/1966 Referring Provider (SLP): Rodolph Bong, Barth Kirks, PA-C (referring)/Roberts, Jori Moll, MD (PCP)   Encounter Date: 04/02/2020   End of Session - 04/02/20 1419    Visit Number 6    Number of Visits 17    Date for SLP Re-Evaluation 05/15/20    SLP Start Time 0804    SLP Stop Time  0845    SLP Time Calculation (min) 41 min    Activity Tolerance Patient tolerated treatment well           Past Medical History:  Diagnosis Date  . Alcohol abuse   . Anxiety   . Atypical nevus 02/06/2015   Right Back - Mild  . Atypical nevus 04/30/2016   Right Chest - Mild  . Atypical nevus 04/30/2016   Right Shoulder - Mild  . Chronic back pain   . Drug abuse ([place])   . Hypertension   . Lumbar disc disease   . Psoriasis     Past Surgical History:  Procedure Laterality Date  . APPENDECTOMY    . SHOULDER SURGERY Left     There were no vitals filed for this visit.   Subjective Assessment - 04/02/20 0844    Subjective "I left my notebook at my office. I spent the whole weekend pretty much at the office."    Currently in Pain? No/denies                 ADULT SLP TREATMENT - 04/02/20 0851      General Information   Behavior/Cognition Alert;Cooperative;Pleasant mood      Treatment Provided   Treatment provided Cognitive-Linquistic      Cognitive-Linquistic Treatment   Treatment focused on Patient/family/caregiver education;Cognition    Skilled Treatment Pt brought his organizer. "I was reminded by text last night to pay for my new health and dental today." Pt tells SLP that he will go off of his wife's group policy for health insurance, and pay for his own policy; filing sepearate for 2021 taxes this year. 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 SLP provided pt with [employer] for a novel task. Pt req'd min-mod extra time to complete this task but was thorough, and demonstrated emergent awareness x1. Pt to complete "organizing your day" task at home for next session.      Assessment / Recommendations / Plan   Plan Continue with current plan of care      Progression Toward Goals   Progression toward goals Progressing toward goals              SLP Short Term Goals - 04/02/20 1420      SLP SHORT TERM GOAL #1   Title Pt will use external aids/alerts to take PM medications 5/7 days.    Baseline 03/28/20    Time 2    Period Weeks    Status On-going      SLP SHORT TERM GOAL #2   Title Pt will establish system for organization/recall and bring to 3 therapy sessions.    Baseline 03-26-20, 04-02-20    Time 2  Period Weeks    Status On-going      SLP SHORT TERM GOAL #3   Title Pt will demo appropriate use of compensations for attention, processing to recall >80% of details from mod complex paragraph (written or verbal presentation), x 3 sessions.    Time 2    Period Weeks    Status On-going            SLP Long Term Goals - 04/02/20 1421      SLP LONG TERM GOAL #1   Title Pt will report no missed medications over 2 consecutive visits with use of external aids/alerts.    Baseline 03/28/20    Time 6    Period Weeks    Status On-going      SLP LONG TERM GOAL #2   Title Pt will demonstrate appropriate judgment, anticipatory awareness of tasks to avoid and accomodations necessary for workplace safety.    Time 6    Period Weeks    Status On-going      SLP LONG TERM GOAL #3   Title Pt will use organization/recall system to manage appointments, finances and to dos independently over 3 sessions.     Time 6    Period Weeks    Status On-going      SLP LONG TERM GOAL #4   Title Pt will demo divided attention between 2 personally relevant tasks for 10 minutes maintaining >85% accuracy.    Time 6    Period Weeks    Status On-going            Plan - 04/02/20 1420    Clinical Impression Statement Mr. 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 Since his brain injury, he reports increased difficulty concentrating, following complex instructions, planning and keeping appointments, losing items, remembering names, and making simple mistakes. 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 PT continues working on balance. I recommend skilled ST to maximize pt's cognitive abilities to ensure safety and improve ability to fulfill duties in the workplace and at home.    Speech Therapy Frequency 2x / week    Duration 8 weeks   or 17 visits   Treatment/Interventions Environmental controls;Language facilitation;Cueing hierarchy;SLP instruction and feedback;Cognitive reorganization;Compensatory techniques;Functional tasks;Compensatory strategies;Internal/external aids;Patient/family education    Potential to Achieve Goals Good           Patient will benefit from skilled therapeutic intervention in order to improve the following deficits and impairments:   Cognitive communication deficit    Problem List Patient Active Problem List   Diagnosis Date Noted  . Intracranial hemorrhage ([place])   . Multiple closed facial bone fractures ([place])   . Trauma   . Multiple trauma   . Traumatic brain injury with loss of consciousness ([place])   . Essential hypertension   . DDD (degenerative disc disease), lumbosacral   . Polysubstance abuse ([place])   . SAH (subarachnoid hemorrhage) ([place]) 02/13/2020  . OSA  (obstructive sleep apnea) 01/31/2020    [facility] ,MS, CCC-SLP  04/02/2020, 2:22 PM  [facility] [address] [place], [place], [address] Phone: [phone]   Fax:  [phone]   Name: Juan [name] MRN: [id number] Date of Birth: [birth year]

## 2020-04-02 NOTE — Patient Instructions (Signed)
  Please complete the assigned speech therapy homework prior to your next session and return it to the speech therapist at your next visit.  

## 2020-04-04 ENCOUNTER — Ambulatory Visit: Payer: BC Managed Care – PPO | Admitting: Speech Pathology

## 2020-04-04 ENCOUNTER — Ambulatory Visit: Payer: BC Managed Care – PPO | Admitting: Physical Therapy

## 2020-04-10 ENCOUNTER — Ambulatory Visit: Payer: BC Managed Care – PPO | Admitting: Dermatology

## 2020-04-16 ENCOUNTER — Telehealth: Payer: Self-pay | Admitting: Dermatology

## 2020-04-16 DIAGNOSIS — L400 Psoriasis vulgaris: Secondary | ICD-10-CM

## 2020-04-16 MED ORDER — OTEZLA 30 MG PO TABS: 30.0000 mg | ORAL_TABLET | Freq: Two times a day (BID) | ORAL | 0 refills | Status: DC

## 2020-04-16 NOTE — Telephone Encounter (Signed)
Wants refill for Otezla. Said new insurance year starts Wednesday and if we can send in refill TODAY they can fill it tomorrow so it can go on this year's insurance. [facility] is pharmacy [phone])

## 2020-04-17 ENCOUNTER — Ambulatory Visit: Payer: BC Managed Care – PPO

## 2020-04-20 ENCOUNTER — Ambulatory Visit: Payer: 59 | Attending: Internal Medicine

## 2020-04-20 ENCOUNTER — Other Ambulatory Visit: Payer: Self-pay

## 2020-04-20 ENCOUNTER — Ambulatory Visit: Payer: 59 | Admitting: Physical Therapy

## 2020-04-20 ENCOUNTER — Encounter: Payer: Self-pay | Admitting: Physical Therapy

## 2020-04-20 DIAGNOSIS — R2689 Other abnormalities of gait and mobility: Secondary | ICD-10-CM | POA: Diagnosis present

## 2020-04-20 DIAGNOSIS — M6281 Muscle weakness (generalized): Secondary | ICD-10-CM | POA: Diagnosis present

## 2020-04-20 DIAGNOSIS — R41844 Frontal lobe and executive function deficit: Secondary | ICD-10-CM | POA: Insufficient documentation

## 2020-04-20 DIAGNOSIS — R41842 Visuospatial deficit: Secondary | ICD-10-CM | POA: Insufficient documentation

## 2020-04-20 DIAGNOSIS — R41840 Attention and concentration deficit: Secondary | ICD-10-CM | POA: Insufficient documentation

## 2020-04-20 DIAGNOSIS — R41841 Cognitive communication deficit: Secondary | ICD-10-CM | POA: Diagnosis present

## 2020-04-20 DIAGNOSIS — R278 Other lack of coordination: Secondary | ICD-10-CM | POA: Diagnosis present

## 2020-04-20 DIAGNOSIS — R2681 Unsteadiness on feet: Secondary | ICD-10-CM | POA: Insufficient documentation

## 2020-04-20 NOTE — Therapy (Signed)
[facility] [address] [place], [place], [address] Phone: [phone]   Fax:  [phone]  Speech Language Pathology Treatment  Patient Details  Name: Juan [name] MRN: [id number] Date of Birth: 1965-11-25 Referring Provider (SLP): Rodolph Bong, Barth Kirks, PA-C (referring)/Roberts, Jori Moll, MD (PCP)   Encounter Date: 04/20/2020   End of Session - 04/20/20 1400    Visit Number 7    Number of Visits 17    Date for SLP Re-Evaluation 05/15/20    SLP Start Time 0851    SLP Stop Time  0931    SLP Time Calculation (min) 40 min    Activity Tolerance Patient tolerated treatment well           Past Medical History:  Diagnosis Date  . Alcohol abuse   . Anxiety   . Atypical nevus 02/06/2015   Right Back - Mild  . Atypical nevus 04/30/2016   Right Chest - Mild  . Atypical nevus 04/30/2016   Right Shoulder - Mild  . Chronic back pain   . Drug abuse ([place])   . Hypertension   . Lumbar disc disease   . Psoriasis     Past Surgical History:  Procedure Laterality Date  . APPENDECTOMY    . SHOULDER SURGERY Left     There were no vitals filed for this visit.   Subjective Assessment - 04/20/20 1359    Subjective "I brought my folder today for you."    Currently in Pain? No/denies                 ADULT SLP TREATMENT - 04/20/20 0915      General Information   Behavior/Cognition Alert;Cooperative;Pleasant mood      Treatment Provided   Treatment provided Cognitive-Linquistic      Cognitive-Linquistic Treatment   Treatment focused on Cognition    Skilled Treatment Pt brought his notebook, with homework completed. Juan [name] tells SLP that he drove down to [place] [facility]) prior to Thanksgiving. Pt is now writing down names and phone numbers where he didn't have to do this premorbidly. Pt's emergent awareness was demonstrated consistently with cognitive lingusitic therapy tasks today. Pt may need to take some time off  for a course of inpatient behavioral health. Pt unable to tell SLP when next appointment was,  when SLP asked, due to not having December schedule printed off yet.        Assessment / Recommendations / Plan   Plan Continue with current plan of care      Progression Toward Goals   Progression toward goals Progressing toward goals              SLP Short Term Goals - 04/20/20 1402      SLP SHORT TERM GOAL #1   Title Pt will use external aids/alerts to take PM medications 5/7 days.    Baseline 03/28/20    Status Achieved      SLP SHORT TERM GOAL #2   Title Pt will establish system for organization/recall and bring to 3 therapy sessions.    Baseline 03-26-20, 04-02-20    Status Achieved      SLP SHORT TERM GOAL #3   Title Pt will demo appropriate use of compensations for attention, processing to recall >80% of details from mod complex paragraph (written or verbal presentation), x 3 sessions.    Baseline 04-20-20    Time 1    Period Weeks    Status Partially Met  SLP Long Term Goals - 04/20/20 1403      SLP LONG TERM GOAL #1   Title Pt will report no missed medications over 2 consecutive visits with use of external aids/alerts.    Baseline 03/28/20    Time 5    Period Weeks    Status On-going      SLP LONG TERM GOAL #2   Title Pt will demonstrate appropriate judgment, anticipatory awareness of tasks to avoid and accomodations necessary for workplace safety.    Time 5    Period Weeks    Status On-going      SLP LONG TERM GOAL #3   Title Pt will use organization/recall system to manage appointments, finances and to dos independently over 3 sessions.    Baseline 04-20-20    Time 5    Period Weeks    Status On-going      SLP LONG TERM GOAL #4   Title Pt will demo divided attention between 2 personally relevant tasks for 10 minutes maintaining >85% accuracy.    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 Since last session he drove to [place] - 12 hour trip with a friend. He reports dificulty with memory as persisting, specifically remembering names and phone numbers and has a page in his notebook for this. 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 PT continues working on balance. I recommend skilled ST to maximize pt's cognitive abilities to ensure safety and improve ability to fulfill duties in the workplace and at home.    Speech Therapy Frequency 2x / week    Duration 8 weeks   or 17 visits   Treatment/Interventions Environmental controls;Language facilitation;Cueing hierarchy;SLP instruction and feedback;Cognitive reorganization;Compensatory techniques;Functional tasks;Compensatory strategies;Internal/external aids;Patient/family education    Potential to Achieve Goals Good           Patient will benefit from skilled therapeutic intervention in order to improve the following deficits and impairments:   Cognitive communication deficit    Problem List Patient Active Problem List   Diagnosis Date Noted  . Intracranial hemorrhage ([place])   . Multiple closed facial bone fractures ([place])   . Trauma   . Multiple trauma   . Traumatic brain injury with loss of consciousness ([place])   . Essential hypertension   . DDD (degenerative disc disease), lumbosacral   . Polysubstance abuse ([place])   . SAH (subarachnoid hemorrhage) ([place]) 02/13/2020  . OSA (obstructive sleep apnea) 01/31/2020    [facility] ,[place], CCC-SLP  04/20/2020, 2:04 PM  [facility] [address] [place], [place], [address] Phone: [phone]   Fax:  [phone]   Name: Juan [name] MRN:  [id number] Date of Birth: August 09, 1965

## 2020-04-20 NOTE — Therapy (Signed)
[facility] [address] [place], [place], [address] Phone: [phone]   Fax:  [phone]  Physical Therapy Treatment-Arrived No Charge   Patient Details  Name: ABDIRAHIM FLAVELL MRN: [id number] Date of Birth: [birth year] Referring Provider (PT): Jillyn Ledger, [place]   Encounter Date: 04/20/2020   PT End of Session - 04/20/20 0958    Visit Number 3   arrived no charge   Number of Visits 13    Date for PT Re-Evaluation 05/25/20    Authorization Type BCBS - 30 VL for PT and OT, each counts as 1 visit    Authorization - Visit Number 3    Authorization - Number of Visits 30    PT Start Time [phone]   arrived no charge, pt running late from speech therapy   PT Stop Time 0955    PT Time Calculation (min) 16 min    Behavior During Therapy [facility] for tasks assessed/performed           Past Medical History:  Diagnosis Date  . Alcohol abuse   . Anxiety   . Atypical nevus 02/06/2015   Right Back - Mild  . Atypical nevus 04/30/2016   Right Chest - Mild  . Atypical nevus 04/30/2016   Right Shoulder - Mild  . Chronic back pain   . Drug abuse ([place])   . Hypertension   . Lumbar disc disease   . Psoriasis     Past Surgical History:  Procedure Laterality Date  . APPENDECTOMY    . SHOULDER SURGERY Left     There were no vitals filed for this visit.   Subjective Assessment - 04/20/20 0945    Subjective No falls. Thinks he sprained his R ankle on Sunday (has a history of ankle sprains on RLE and it feels like it has in the past). Was just walking and using it when he thinks he sprained it. Has been using anti-inflammatories and icing it and has gotten better but is still pretty swollen and red. Has not been able to walk on it the past couple of days.    Pertinent History PMHx includes afib, HTN, anxiety, hx drug and alcohol abuse.    Limitations Standing;Walking    Patient Stated Goals improve balance             Pt  arrives today and reports he thinks he sprained his R ankle on Sunday. It is still swollen, tender to palpation, and red. Pt has been using anti-inflammatories and has been icing it and reports it has been getting better. Today is the first day that he has been able to walk on it in the past couple days, but states he can't do a lot of walking on it/has not been able to go up/down stairs and pt ambulates with an antalgic pattern/limp with decr stance time on RLE. Pt reports he has not yet been to the MD/urgent care about it, but reports has an appt on Monday. Discussed with pt about holding off on therapy today due to not wanting to make it worse. Pt to continue to rest over the weekend and will see his MD on Monday, pt in agreement with this plan.                           PT Short Term Goals - 03/28/20 0903      PT SHORT TERM GOAL #1   Title Pt will  be independent with initial HEP in order to build upon functional gains made in therapy. ALL STGS DUE 04/16/20    Time 3    Period Weeks    Status New    Target Date 04/16/20      PT SHORT TERM GOAL #2   Title Perform 6MWT with LTG to be written as appropriate.    Baseline 1,135' on 03/28/20    Time 3    Period Weeks    Status Achieved      PT SHORT TERM GOAL #3   Title Pt will decr 5x sit <> stand time to 17 seconds or less with no UE support in order to demo improved functional BLE strength.    Baseline 21.38 seconds    Time 3    Period Weeks      PT SHORT TERM GOAL #4   Title Pt will improve FGA score to at least a 22/30 in order to demo decr fall risk.    Baseline 19/30    Time 3    Period Weeks    Status New             PT Long Term Goals - 03/28/20 1610      PT LONG TERM GOAL #1   Title Pt will be independent with final HEP in order to build upon functional gains made in therapy. ALL LTGS DUE 05/07/20    Time 6    Period Weeks    Status New      PT LONG TERM GOAL #2   Title Pt will improve 6MWT  by 150' to demo improved walking endurance.    Baseline 1,135' on 03/28/20    Time 6    Period Weeks    Status Revised      PT LONG TERM GOAL #3   Title Pt will improve FGA score to at least a 22/30 in order to demo decr fall risk.    Baseline 25/30    Time 6    Period Weeks    Status New      PT LONG TERM GOAL #4   Title Pt will ambulate 1,000' over outdoor unlevel surfaces with supervision in order to demo improved community mobility.    Time 6    Period Weeks    Status New      PT LONG TERM GOAL #5   Title Pt will improve gait speed to at least 3.5 ft/sec for improved gait efficiency.    Baseline 3.05 ft/sec    Time 6    Period Weeks    Status New                  Patient will benefit from skilled therapeutic intervention in order to improve the following deficits and impairments:     Visit Diagnosis: Unsteadiness on feet     Problem List Patient Active Problem List   Diagnosis Date Noted  . Intracranial hemorrhage ([place])   . Multiple closed facial bone fractures ([place])   . Trauma   . Multiple trauma   . Traumatic brain injury with loss of consciousness ([place])   . Essential hypertension   . DDD (degenerative disc disease), lumbosacral   . Polysubstance abuse ([place])   . SAH (subarachnoid hemorrhage) ([place]) 02/13/2020  . OSA (obstructive sleep apnea) 01/31/2020    Arliss Journey, PT, DPT  04/20/2020, 9:59 AM  [facility] [address] [place], [place], [address] Phone: [phone]  Fax:  [phone]  Name: SYLIS KETCHUM MRN: [id number] Date of Birth: [birth year]

## 2020-04-23 ENCOUNTER — Ambulatory Visit: Payer: 59

## 2020-04-23 ENCOUNTER — Other Ambulatory Visit: Payer: Self-pay

## 2020-04-23 ENCOUNTER — Encounter: Payer: Self-pay | Admitting: Physical Therapy

## 2020-04-23 ENCOUNTER — Ambulatory Visit: Payer: 59 | Admitting: Physical Therapy

## 2020-04-23 ENCOUNTER — Ambulatory Visit: Payer: 59 | Admitting: Occupational Therapy

## 2020-04-23 DIAGNOSIS — R41841 Cognitive communication deficit: Secondary | ICD-10-CM | POA: Diagnosis not present

## 2020-04-23 DIAGNOSIS — R2689 Other abnormalities of gait and mobility: Secondary | ICD-10-CM

## 2020-04-23 DIAGNOSIS — M6281 Muscle weakness (generalized): Secondary | ICD-10-CM

## 2020-04-23 DIAGNOSIS — R278 Other lack of coordination: Secondary | ICD-10-CM

## 2020-04-23 DIAGNOSIS — R2681 Unsteadiness on feet: Secondary | ICD-10-CM

## 2020-04-23 NOTE — Therapy (Signed)
[facility] [address] [place], [place], [address] Phone: [phone]   Fax:  [phone]  Physical Therapy Treatment  Patient Details  Name: Juan [name] MRN: [id number] Date of Birth: [birth year] Referring Provider (PT): Jillyn Ledger, [place]   Encounter Date: 04/23/2020   PT End of Session - 04/23/20 1340    Visit Number 4    Number of Visits 13    Date for PT Re-Evaluation 05/25/20    Authorization Type BCBS - 30 VL for PT and OT, each counts as 1 visit    Authorization - Visit Number 4    Authorization - Number of Visits 30    PT Start Time 7341    PT Stop Time 1145    PT Time Calculation (min) 40 min    Equipment Utilized During Treatment Gait belt    Activity Tolerance Patient tolerated treatment well    Behavior During Therapy WFL for tasks assessed/performed           Past Medical History:  Diagnosis Date  . Alcohol abuse   . Anxiety   . Atypical nevus 02/06/2015   Right Back - Mild  . Atypical nevus 04/30/2016   Right Chest - Mild  . Atypical nevus 04/30/2016   Right Shoulder - Mild  . Chronic back pain   . Drug abuse ([place])   . Hypertension   . Lumbar disc disease   . Psoriasis     Past Surgical History:  Procedure Laterality Date  . APPENDECTOMY    . SHOULDER SURGERY Left     There were no vitals filed for this visit.   Subjective Assessment - 04/23/20 1107    Subjective Ankle is feeling a lot better today, not as swollen. Ankle hurts a little bit.    Pertinent History PMHx includes afib, HTN, anxiety, hx drug and alcohol abuse.    Limitations Standing;Walking    Patient Stated Goals improve balance    Currently in Pain? Yes    Pain Score 3     Pain Location Ankle    Pain Orientation Left    Pain Descriptors / Indicators Aching;Sore    Pain Type Acute pain    Aggravating Factors  walking on it, using it    Pain Relieving Factors rest, anti-inflammatory, icing               [facility] PT Assessment - 04/23/20 1113      Functional Gait  Assessment   Gait assessed  Yes    Gait Level Surface Walks 20 ft, slow speed, abnormal gait pattern, evidence for imbalance or deviates 10-15 in outside of the 12 in walkway width. Requires more than 7 sec to ambulate 20 ft.   7.5 seconds   Change in Gait Speed Able to smoothly change walking speed without loss of balance or gait deviation. Deviate no more than 6 in outside of the 12 in walkway width.    Gait with Horizontal Head Turns Performs head turns smoothly with slight change in gait velocity (eg, minor disruption to smooth gait path), deviates 6-10 in outside 12 in walkway width, or uses an assistive device.    Gait with Vertical Head Turns Performs task with slight change in gait velocity (eg, minor disruption to smooth gait path), deviates 6 - 10 in outside 12 in walkway width or uses assistive device    Gait and Pivot Turn Pivot turns safely within 3 sec and stops quickly with no loss  of balance.    Step Over Obstacle Is able to step over 2 stacked shoe boxes taped together (9 in total height) without changing gait speed. No evidence of imbalance.    Gait with Narrow Base of Support Is able to ambulate for 10 steps heel to toe with no staggering.    Gait with Eyes Closed Walks 20 ft, uses assistive device, slower speed, mild gait deviations, deviates 6-10 in outside 12 in walkway width. Ambulates 20 ft in less than 9 sec but greater than 7 sec.   8.84 seconds   Ambulating Backwards Walks 20 ft, no assistive devices, good speed, no evidence for imbalance, normal gait    Steps Alternating feet, no rail.    Total Score 25    FGA comment: 25/30                         [facility] Adult PT Treatment/Exercise - 04/23/20 0001      Transfers   Sit to Stand 5: Supervision;Without upper extremity assist;From chair/3-in-1    Five time sit to stand comments  13.59 seconds with no UE support    Stand to Sit 5:  Supervision;Without upper extremity assist;To chair/3-in-1      Ambulation/Gait   Ambulation/Gait Yes    Ambulation/Gait Assistance 5: Supervision    Ambulation/Gait Assistance Details throughout session with L ankle ASO donned for improved stability     Ambulation Distance (Feet) 230 Feet    Assistive device None    Gait Pattern Step-through pattern;Decreased arm swing - left;Decreased arm swing - right    Ambulation Surface Level;Indoor               Balance Exercises - 04/23/20 0001      Balance Exercises: Standing   Rockerboard Anterior/posterior;Limitations    Rockerboard Limitations eyes closed 2 x 30 seconds holding board still, with eyes closed: 2 x 10 reps head nods, 2 x 10 reps head turns, min guard/min A for balance and intermittent touch to bars for balance   Other Standing Exercises on black side of BOSU: x10 reps mini squats, initial cues for proper technique              PT Education - 04/23/20 1227    Education Details discussed pt making an appt with his foot doctor/getting a new pair of shoes for improved stability and with arch support (as pt just wearing vans and has hx of ankle instability), pt verbalized understanding and has been meaning to do so, progress towards goals.Terence Lux) Educated Patient    Methods Explanation    Comprehension Verbalized understanding            PT Short Term Goals - 04/23/20 1121      PT SHORT TERM GOAL #1   Title Pt will be independent with initial HEP in order to build upon functional gains made in therapy. ALL STGS DUE 04/16/20    Baseline pt reports doing them at home when he can - recently hasn't been able to because of his R ankle    Time 3    Period Weeks    Status Partially Met    Target Date 04/16/20      PT SHORT TERM GOAL #2   Title Perform 6MWT with LTG to be written as appropriate.    Baseline 1,135' on 03/28/20    Time 3    Period Weeks    Status Achieved  PT SHORT TERM GOAL #3   Title  Pt will decr 5x sit <> stand time to 17 seconds or less with no UE support in order to demo improved functional BLE strength.    Baseline 21.38 seconds, 13.59 seconds with no UE support    Time 3    Period Weeks    Status Achieved      PT SHORT TERM GOAL #4   Title Pt will improve FGA score to at least a 22/30 in order to demo decr fall risk.    Baseline 19/30, 25/30 on 04/23/20    Time 3    Period Weeks    Status Achieved             PT Long Term Goals - 04/23/20 1228      PT LONG TERM GOAL #1   Title Pt will be independent with final HEP in order to build upon functional gains made in therapy. ALL LTGS DUE 05/07/20    Time 6    Period Weeks    Status New      PT LONG TERM GOAL #2   Title Pt will improve 6MWT by 150' to demo improved walking endurance.    Baseline 1,135' on 03/28/20    Time 6    Period Weeks    Status Revised      PT LONG TERM GOAL #3   Title Pt will improve FGA score to at least a 28/30 in order to demo decr fall risk.    Baseline 25/30 on 04/23/20    Time 6    Period Weeks    Status Revised      PT LONG TERM GOAL #4   Title Pt will ambulate 1,000' over outdoor unlevel surfaces with supervision in order to demo improved community mobility.    Time 6    Period Weeks    Status New      PT LONG TERM GOAL #5   Title Pt will improve gait speed to at least 3.5 ft/sec for improved gait efficiency.    Baseline 3.05 ft/sec    Time 6    Period Weeks    Status New                 Plan - 04/23/20 1344    Clinical Impression Statement Pt's R ankle much less swollen and less red compared to Friday. Donned R ankle ASO for improved ankle stability during session with pt reporting no issues. Checked STGs with pt achieving 3 out of 4 STGs. Pt partially met STG #1 - hasn't been able to perform HEP consistently due to R ankle. Pt improved 5x sit <> stand to 13.59 seconds with no UE support (previously 21.38 seconds), and improved FGA to a 25/30, indicating  that pt is now at a low fall risk. LTGs revised as appropriate. Remainder of session focused on standing balance strategies with vision removed/ head motions with no issues noted. Will continue to progress towards LTGs.    Personal Factors and Comorbidities Comorbidity 3+;Past/Current Experience;Profession    Comorbidities afib, HTN, anxiety, hx drug and alcohol abuse.    Examination-Activity Limitations Locomotion Level;Stairs;Transfers    Examination-Participation Restrictions Community Activity;Occupation    Stability/Clinical Decision Making Stable/Uncomplicated    Rehab Potential Good    PT Frequency 2x / week    PT Duration 6 weeks    PT Treatment/Interventions ADLs/Self Care Home Management;Gait training;Stair training;Therapeutic activities;Neuromuscular re-education;Balance training;Therapeutic exercise;Patient/family education;Vestibular    PT Next Visit Plan  how is R ankle? cog dual tasking with gait/balance. hip extensor strengthening.  high level balance with vestibular input, tandem, SLS.    Consulted and Agree with Plan of Care Patient           Patient will benefit from skilled therapeutic intervention in order to improve the following deficits and impairments:  Abnormal gait, Decreased activity tolerance, Decreased balance, Decreased endurance, Decreased strength, Difficulty walking, Dizziness  Visit Diagnosis: Unsteadiness on feet  Muscle weakness (generalized)  Other abnormalities of gait and mobility  Other lack of coordination     Problem List Patient Active Problem List   Diagnosis Date Noted  . Intracranial hemorrhage ([place])   . Multiple closed facial bone fractures ([place])   . Trauma   . Multiple trauma   . Traumatic brain injury with loss of consciousness ([place])   . Essential hypertension   . DDD (degenerative disc disease), lumbosacral   . Polysubstance abuse ([place])   . SAH (subarachnoid hemorrhage) ([place]) 02/13/2020  . OSA (obstructive sleep apnea)  01/31/2020    Arliss Journey, PT, DPT  04/23/2020, 1:45 PM  [facility] [address] [place], [place], [address] Phone: [phone]   Fax:  [phone]  Name: Juan [name] MRN: [id number] Date of Birth: 10-Sep-1965

## 2020-04-24 ENCOUNTER — Encounter: Payer: BC Managed Care – PPO | Admitting: Occupational Therapy

## 2020-04-24 ENCOUNTER — Ambulatory Visit: Payer: BC Managed Care – PPO | Admitting: Physical Therapy

## 2020-04-26 ENCOUNTER — Ambulatory Visit: Payer: 59 | Admitting: Physical Therapy

## 2020-04-26 ENCOUNTER — Ambulatory Visit: Payer: 59 | Admitting: Occupational Therapy

## 2020-04-26 ENCOUNTER — Ambulatory Visit: Payer: 59

## 2020-04-27 ENCOUNTER — Encounter: Payer: Self-pay | Admitting: Occupational Therapy

## 2020-04-27 ENCOUNTER — Encounter: Payer: BC Managed Care – PPO | Admitting: Occupational Therapy

## 2020-04-27 ENCOUNTER — Ambulatory Visit: Payer: 59 | Admitting: Occupational Therapy

## 2020-04-27 ENCOUNTER — Other Ambulatory Visit: Payer: Self-pay

## 2020-04-27 ENCOUNTER — Ambulatory Visit: Payer: BC Managed Care – PPO | Admitting: Physical Therapy

## 2020-04-27 DIAGNOSIS — R41842 Visuospatial deficit: Secondary | ICD-10-CM

## 2020-04-27 DIAGNOSIS — R278 Other lack of coordination: Secondary | ICD-10-CM

## 2020-04-27 DIAGNOSIS — R41841 Cognitive communication deficit: Secondary | ICD-10-CM | POA: Diagnosis not present

## 2020-04-27 DIAGNOSIS — R41844 Frontal lobe and executive function deficit: Secondary | ICD-10-CM

## 2020-04-27 DIAGNOSIS — R41840 Attention and concentration deficit: Secondary | ICD-10-CM

## 2020-04-27 DIAGNOSIS — M6281 Muscle weakness (generalized): Secondary | ICD-10-CM

## 2020-04-27 NOTE — Patient Instructions (Addendum)
Memory Compensation Strategies  1. Use "WARM" strategy. W= write it down A=  associate it R=  repeat it M=  make a mental picture  2. You can keep a Social worker. Use a 3-ring notebook with sections for the following:  calendar, important names and phone numbers, medications, doctors' names/phone numbers, "to do list"/reminders, and a section to journal what you did each day  3. Use a calendar to write appointments down.  4. Write yourself a schedule for the day.  This can be placed on the calendar or in a separate section of the Memory Notebook.  Keeping a regular schedule can help memory.  5. Use medication organizer with sections for each day or morning/evening pills  You may need help loading it  6. Keep a basket, or pegboard by the door.   Place items that you need to take out with you in the basket or on the pegboard.  You may also want to include a message board for reminders.  7. Use sticky notes. Place sticky notes with reminders in a place where the task is performed.  For example:  "turn off the stove" placed by the stove, "lock the door" placed on the door at eye level, "take your medications" on the bathroom mirror or by the place where you normally take your medications  8. Use alarms/timers.  Use while cooking to remind yourself to check on food or as a reminder to take your medicine, or as a reminder to make a call, or as a reminder to perform another task, etc.  9. Use a small tape recorder to record important information and notes for yourself.       Double vision  HEP:  Perform at least 1-2 times per day. Stop if your eye becomes fatigued or hurts and try again later.  1. Hold a small object/card in front of you.  Hold it in the middle at arm's length away.    2. Cover your LEFT eye and look at the object with your RIGHT eye.  3. Slowly move the object side to side in front of you while continuing to watch it with your RIGHT eye.  4.  Remember to  keep your head still and only move your eye.  5.  Repeat 5-10 times.  6.  Then, move object up and down while watching it 5-10 times.  7. Cover your RIGHT eye and look at the object with your LEFT eye while you repeat #1-6 above.  8.  Now, uncover both eyes and try to focus on the object while holding it in the middle.  Try to make it 1 image.   9.  If you can, try to hold it for 10-30 sec increasing as able.    10.  Once you can make the image 1 for at least 30 sec in the middle, repeat #1-6 above with both eyes moving slowly and only in the range that you can keep the image 1.    Hold card in front of you move card towards you and away from you only within the range that it is not double 10 reps        1. Grip Strengthening (Resistive Putty)   Squeeze putty using thumb and all fingers. Repeat _20___ times. Do __2__ sessions per day.   2. Roll putty into tube on table and pinch between each finger and thumb x 10 reps each. (can do ring and small finger together)     Copyright  VHI. All rights reserved.

## 2020-04-27 NOTE — Therapy (Signed)
[facility] [address] [place], [place], [address] Phone: [phone]   Fax:  [phone]  Occupational Therapy Treatment  Patient Details  Name: Juan [name] MRN: [id number] Date of Birth: 1965-12-28 Referring Provider (OT): Leary Roca PA-C   Encounter Date: 04/27/2020   OT End of Session - 04/27/20 1044    Visit Number 2    Number of Visits 25    Date for OT Re-Evaluation 06/15/20    Authorization Type BCBS    Authorization Time Period VL 30 sessions    OT Start Time 1020    OT Stop Time 1100    OT Time Calculation (min) 40 min    Activity Tolerance Patient tolerated treatment well    Behavior During Therapy [facility] for tasks assessed/performed           Past Medical History:  Diagnosis Date  . Alcohol abuse   . Anxiety   . Atypical nevus 02/06/2015   Right Back - Mild  . Atypical nevus 04/30/2016   Right Chest - Mild  . Atypical nevus 04/30/2016   Right Shoulder - Mild  . Chronic back pain   . Drug abuse (HCC)   . Hypertension   . Lumbar disc disease   . Psoriasis     Past Surgical History:  Procedure Laterality Date  . APPENDECTOMY    . SHOULDER SURGERY Left     There were no vitals filed for this visit.   Subjective Assessment - 04/27/20 1022    Subjective  Pt reports he fell and hurt his knee    Pertinent History PMHx includes afib, HTN, anxiety, hx drug and alcohol abuse.    Patient Stated Goals to see how i am doing    Currently in Pain? Yes    Pain Score 5     Pain Location Knee    Pain Orientation Left    Pain Descriptors / Indicators Aching    Pain Type Acute pain    Pain Onset More than a month ago    Pain Frequency Intermittent    Aggravating Factors  walking    Pain Relieving Factors rest                  Treatment: Pt demonstrates diplopia with tracking  to left peripheral visual field and with convergence, HEP issued              OT  Treatment/  Education - 04/27/20 1057    Education Details vision HEP for diplopia at left peripheral visual field, green putty HEP, memory compensations- see pt instructions.    Person(s) Educated Patient    Methods Explanation;Demonstration;Verbal cues;Handout    Comprehension Verbalized understanding;Returned demonstration;Verbal cues required            OT Short Term Goals - 04/27/20 1023      OT SHORT TERM GOAL #1   Title Pt will be independent with HEP 04/20/20    Time 4    Period Weeks    Status On-going    Target Date 04/20/20      OT SHORT TERM GOAL #2   Title Pt will complete simple warm meal prep or home management tasks with supervision for increase in independence with ADLs and IADLs    Time 4    Period Weeks    Status On-going      OT SHORT TERM GOAL #3   Title Pt will perform environmental scanning in mod distracting environment with  80% accuracy    Time 4    Period Weeks    Status On-going      OT SHORT TERM GOAL #4   Title Pt will demonstrate improved functional LUE use by increasing Box and Blocks score by 7 blocks.    Baseline LUE 34    Time 4    Period Weeks    Status On-going      OT SHORT TERM GOAL #5   Title Pt will verblize understanding of return to driving recommendations for safety.    Time 4    Period Weeks    Status On-going      OT SHORT TERM GOAL #6   Title Pt will demonstrate improved grip strength by increasing LUE grip strength to 54 lbs or greater.    Baseline LUE 49.3 lbs    Time 4    Period Weeks    Status On-going             OT Long Term Goals - 03/23/20 1253      OT LONG TERM GOAL #1   Title Pt will be independent with updated HEP 06/15/2020    Time 12    Period Weeks    Status New    Target Date 06/15/20      OT LONG TERM GOAL #2   Title Pt will complete cooking task with following recipe or instructions with mod I for increase in independent with IADLs    Time 12    Period Weeks    Status New      OT LONG  TERM GOAL #3   Title Pt will perform physical and cognitive task simultaneously in mod distracting environment with 90% accuracy    Time 12    Period Weeks    Status New      OT LONG TERM GOAL #4   Title Pt will demonstrate improved grip strength by increasing LUE grip strength to 62 lbs or greater    Baseline LUE 49.3    Time 12    Period Weeks    Status New      OT LONG TERM GOAL #5   Title Pt will demonstrate improved functional LUE use by increasing Box and Blocks score to 48 blocks or greater    Baseline LUE 34    Time 12    Period Weeks    Status New      OT LONG TERM GOAL #6   Title Pt will perform work simulated tasks with 90% accuracy to prepare for readiness for return to work.    Time 12    Period Weeks    Status New                 Plan - 04/27/20 1034    Clinical Impression Statement Pt is progressing towards goals. He demonstrates understanding of inital vision HEP.    OT Occupational Profile and History Detailed Assessment- Review of Records and additional review of physical, cognitive, psychosocial history related to current functional performance    Occupational performance deficits (Please refer to evaluation for details): IADL's;ADL's;Play;Work    Games developer / Function / Physical Skills FMC;Coordination;Flexibility;Mobility;Sensation;Vision;Endurance;IADL;ROM;UE functional use;GMC;Balance;ADL;Gait;Strength;Dexterity    Cognitive Skills Attention;Problem Solve;Safety Awareness;Sequencing;Understand;Memory;Perception;Thought;Learn    Rehab Potential Good    Clinical Decision Making Limited treatment options, no task modification necessary    Comorbidities Affecting Occupational Performance: None    Modification or Assistance to Complete Evaluation  No modification of tasks or assist necessary to complete  eval    OT Frequency 2x / week    OT Duration 12 weeks    OT Treatment/Interventions Patient/family education;Neuromuscular education;Functional  Mobility Training;Visual/perceptual remediation/compensation;Cognitive remediation/compensation;Therapeutic exercise;Therapeutic activities;Balance training;DME and/or AE instruction;Self-care/ADL training;Moist Heat    Plan Trail Making Tests A&B- pt did not have glasses last visit, environmental scanning, check to see how vision HEP is going    Recommended Other Services seeing PT and ST at this location    Consulted and Agree with Plan of Care Patient           Patient will benefit from skilled therapeutic intervention in order to improve the following deficits and impairments:   Body Structure / Function / Physical Skills: FMC,Coordination,Flexibility,Mobility,Sensation,Vision,Endurance,IADL,ROM,UE functional use,GMC,Balance,ADL,Gait,Strength,Dexterity Cognitive Skills: Attention,Problem Solve,Safety Awareness,Sequencing,Understand,Memory,Perception,Thought,Learn     Visit Diagnosis: Muscle weakness (generalized)  Other lack of coordination  Attention and concentration deficit  Visuospatial deficit  Frontal lobe and executive function deficit    Problem List Patient Active Problem List   Diagnosis Date Noted  . Intracranial hemorrhage (HCC)   . Multiple closed facial bone fractures (HCC)   . Trauma   . Multiple trauma   . Traumatic brain injury with loss of consciousness (HCC)   . Essential hypertension   . DDD (degenerative disc disease), lumbosacral   . Polysubstance abuse (HCC)   . SAH (subarachnoid hemorrhage) (HCC) 02/13/2020  . OSA (obstructive sleep apnea) 01/31/2020    Zhaniya Swallows 04/27/2020, 2:53 PM Keene Breath, OTR/L [phone] Phone: [phone] 2:55 PM 12/10/21[facility] [address] [place], [place], [address] Phone: [phone]   Fax:  [phone]  Name: TANYA RUNNING MRN: [id number] Date of Birth: Oct 30, 1965

## 2020-04-30 ENCOUNTER — Other Ambulatory Visit: Payer: Self-pay

## 2020-04-30 ENCOUNTER — Ambulatory Visit: Payer: 59 | Admitting: Occupational Therapy

## 2020-04-30 ENCOUNTER — Ambulatory Visit: Payer: 59 | Admitting: Physical Therapy

## 2020-04-30 ENCOUNTER — Encounter: Payer: Self-pay | Admitting: Physical Therapy

## 2020-04-30 ENCOUNTER — Ambulatory Visit: Payer: 59 | Admitting: Speech Pathology

## 2020-04-30 ENCOUNTER — Encounter: Payer: Self-pay | Admitting: Speech Pathology

## 2020-04-30 DIAGNOSIS — R41842 Visuospatial deficit: Secondary | ICD-10-CM

## 2020-04-30 DIAGNOSIS — R278 Other lack of coordination: Secondary | ICD-10-CM

## 2020-04-30 DIAGNOSIS — M6281 Muscle weakness (generalized): Secondary | ICD-10-CM

## 2020-04-30 DIAGNOSIS — R41840 Attention and concentration deficit: Secondary | ICD-10-CM

## 2020-04-30 DIAGNOSIS — R2689 Other abnormalities of gait and mobility: Secondary | ICD-10-CM

## 2020-04-30 DIAGNOSIS — R41841 Cognitive communication deficit: Secondary | ICD-10-CM | POA: Diagnosis not present

## 2020-04-30 DIAGNOSIS — R41844 Frontal lobe and executive function deficit: Secondary | ICD-10-CM

## 2020-04-30 DIAGNOSIS — R2681 Unsteadiness on feet: Secondary | ICD-10-CM

## 2020-04-30 NOTE — Therapy (Signed)
[facility] [address] [place], [place], [address] Phone: [phone]   Fax:  [phone]  Physical Therapy Treatment  Patient Details  Name: Juan [name] MRN: [id number] Date of Birth: Oct 21, 1965 Referring Provider (PT): Jillyn Ledger, [place]   Encounter Date: 04/30/2020   PT End of Session - 04/30/20 1202    Visit Number 5    Number of Visits 13    Date for PT Re-Evaluation 05/25/20    Authorization Type BCBS - 30 VL for PT and OT, each counts as 1 visit    Authorization - Visit Number 5    Authorization - Number of Visits 30    PT Start Time 1607    PT Stop Time 1142    PT Time Calculation (min) 39 min    Equipment Utilized During Treatment Gait belt    Activity Tolerance Patient tolerated treatment well    Behavior During Therapy WFL for tasks assessed/performed           Past Medical History:  Diagnosis Date  . Alcohol abuse   . Anxiety   . Atypical nevus 02/06/2015   Right Back - Mild  . Atypical nevus 04/30/2016   Right Chest - Mild  . Atypical nevus 04/30/2016   Right Shoulder - Mild  . Chronic back pain   . Drug abuse ([place])   . Hypertension   . Lumbar disc disease   . Psoriasis     Past Surgical History:  Procedure Laterality Date  . APPENDECTOMY    . SHOULDER SURGERY Left     There were no vitals filed for this visit.   Subjective Assessment - 04/30/20 1104    Subjective L eye was tired over the weekend after doing visual exercises with OT. Saw the orthopedist last monday - stated to just continue icing/elevating and wearing L ankle brace. Pt wearing L ankle brace today. Ankle is not hurting today.    Pertinent History PMHx includes afib, HTN, anxiety, hx drug and alcohol abuse.    Limitations Standing;Walking    Patient Stated Goals improve balance    Currently in Pain? Yes    Pain Score 6     Pain Location Eye    Pain Orientation Left    Pain Descriptors / Indicators --    little pain but double vision   Pain Type Acute pain    Aggravating Factors  using it (later in the day), sunlight.    Pain Relieving Factors sunglasses and rest.    Multiple Pain Sites Yes    Pain Score 6    Pain Location Knee    Pain Orientation Right   R>L   Pain Descriptors / Indicators Sore    Pain Type Acute pain    Aggravating Factors  not really - using it.    Pain Relieving Factors not using it, exercise, rest                      Access Code: [email or web address] URL: [email or web address] Date: 04/30/2020 Prepared by: Janann August  Upgraded/revised pt's HEP. See MedBridge for any further details.  Exercises Tandem Walking with Counter Support - 2 x daily - 5 x weekly - 3 sets -forward tandem gait with head turns R/L down and back x3 reps, then performed retro tandem gait at countertop x2 reps.   Single Leg Stance on Foam Pad - 1 x daily - 5 x weekly - 3 sets - 15 hold -  progressed to making it thicker at home, having pt stand on an additional pillow/blanket under his dog bed, standing on LLE  Romberg Stance with Head Nods on Foam Pad - 1 x daily - 5 x weekly - 2 sets - 10 reps - with eyes closed - making surface thicker that pt stands on and added diagonal head turns in both directions x5 reps each  Romberg Stance on Foam Pad with Head Rotation - 1 x daily - 5 x weekly - 2 sets - 10 reps  Standing Marching - 2 x daily - 5 x weekly - 3 sets - with blue tband resistance, forwards and backwards marching at countertop x3 reps down and back, cues for incr step backwards for incr hip extensor activation   Backward Monster Walk with Resistance at [employer] and Counter Support - 2 x daily - 5 x weekly - 2 sets - 3 reps, down and back x3 reps with blue tband resistance around thighs, cues to hold mini squat position and perform with hip extension/wider BOS                 PT Education - 04/30/20 1201    Education Details upgrades to pt's HEP     Person(s) Educated Patient    Methods Explanation;Demonstration;Handout;Verbal cues    Comprehension Verbalized understanding;Returned demonstration            PT Short Term Goals - 04/23/20 1121      PT SHORT TERM GOAL #1   Title Pt will be independent with initial HEP in order to build upon functional gains made in therapy. ALL STGS DUE 04/16/20    Baseline pt reports doing them at home when he can - recently hasn't been able to because of his R ankle    Time 3    Period Weeks    Status Partially Met    Target Date 04/16/20      PT SHORT TERM GOAL #2   Title Perform 6MWT with LTG to be written as appropriate.    Baseline 1,135' on 03/28/20    Time 3    Period Weeks    Status Achieved      PT SHORT TERM GOAL #3   Title Pt will decr 5x sit <> stand time to 17 seconds or less with no UE support in order to demo improved functional BLE strength.    Baseline 21.38 seconds, 13.59 seconds with no UE support    Time 3    Period Weeks    Status Achieved      PT SHORT TERM GOAL #4   Title Pt will improve FGA score to at least a 22/30 in order to demo decr fall risk.    Baseline 19/30, 25/30 on 04/23/20    Time 3    Period Weeks    Status Achieved             PT Long Term Goals - 04/23/20 1228      PT LONG TERM GOAL #1   Title Pt will be independent with final HEP in order to build upon functional gains made in therapy. ALL LTGS DUE 05/07/20    Time 6    Period Weeks    Status New      PT LONG TERM GOAL #2   Title Pt will improve 6MWT by 150' to demo improved walking endurance.    Baseline 1,135' on 03/28/20    Time 6    Period Weeks  Status Revised      PT LONG TERM GOAL #3   Title Pt will improve FGA score to at least a 28/30 in order to demo decr fall risk.    Baseline 25/30 on 04/23/20    Time 6    Period Weeks    Status Revised      PT LONG TERM GOAL #4   Title Pt will ambulate 1,000' over outdoor unlevel surfaces with supervision in order to demo  improved community mobility.    Time 6    Period Weeks    Status New      PT LONG TERM GOAL #5   Title Pt will improve gait speed to at least 3.5 ft/sec for improved gait efficiency.    Baseline 3.05 ft/sec    Time 6    Period Weeks    Status New                 Plan - 04/30/20 1204    Clinical Impression Statement Today's skilled session focused on upgrading pt's HEP for balance/BLE strengthening. Pt tolerated session well with no issues reported with R ankle. Progressed SLS and balance eyes closed on compliant surface by making what pt stands on thicker at home. Pt is progressing well, will continue to progress towards LTGs.    Personal Factors and Comorbidities Comorbidity 3+;Past/Current Experience;Profession    Comorbidities afib, HTN, anxiety, hx drug and alcohol abuse.    Examination-Activity Limitations Locomotion Level;Stairs;Transfers    Examination-Participation Restrictions Community Activity;Occupation    Stability/Clinical Decision Making Stable/Uncomplicated    Rehab Potential Good    PT Frequency 2x / week    PT Duration 6 weeks    PT Treatment/Interventions ADLs/Self Care Home Management;Gait training;Stair training;Therapeutic activities;Neuromuscular re-education;Balance training;Therapeutic exercise;Patient/family education;Vestibular    PT Next Visit Plan cog dual tasking with gait/balance. hip extensor strengthening.  high level balance with vestibular input, tandem, SLS.    Consulted and Agree with Plan of Care Patient           Patient will benefit from skilled therapeutic intervention in order to improve the following deficits and impairments:  Abnormal gait,Decreased activity tolerance,Decreased balance,Decreased endurance,Decreased strength,Difficulty walking,Dizziness  Visit Diagnosis: Muscle weakness (generalized)  Other lack of coordination  Unsteadiness on feet  Other abnormalities of gait and mobility     Problem List Patient  Active Problem List   Diagnosis Date Noted  . Intracranial hemorrhage ([place])   . Multiple closed facial bone fractures ([place])   . Trauma   . Multiple trauma   . Traumatic brain injury with loss of consciousness ([place])   . Essential hypertension   . DDD (degenerative disc disease), lumbosacral   . Polysubstance abuse ([place])   . SAH (subarachnoid hemorrhage) ([place]) 02/13/2020  . OSA (obstructive sleep apnea) 01/31/2020    Arliss Journey, PT, DPT  04/30/2020, 12:06 PM  [facility] [address] [place], [place], [address] Phone: [phone]   Fax:  [phone]  Name: MERCURY ROCK MRN: [id number] Date of Birth: 31-Dec-1965

## 2020-04-30 NOTE — Patient Instructions (Signed)
Access Code: [email or web address] URL: [email or web address] Date: 04/30/2020 Prepared by: Janann August  Exercises Tandem Walking with Counter Support - 2 x daily - 5 x weekly - 3 sets Single Leg Stance on Foam Pad - 1 x daily - 5 x weekly - 3 sets - 15 hold Romberg Stance with Head Nods on Foam Pad - 1 x daily - 5 x weekly - 2 sets - 10 reps Romberg Stance on Foam Pad with Head Rotation - 1 x daily - 5 x weekly - 2 sets - 10 reps Standing Marching - 2 x daily - 5 x weekly - 3 sets Backward Monster Walk with Resistance at Thighs and Counter Support - 2 x daily - 5 x weekly - 2 sets - 3 reps

## 2020-04-30 NOTE — Patient Instructions (Signed)
  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

## 2020-04-30 NOTE — Therapy (Signed)
[facility] [address] [place], [place], [address] Phone: [phone]   Fax:  [phone]  Speech Language Pathology Treatment  Patient Details  Name: Juan [name] MRN: [id number] Date of Birth: Feb 11, 1966 Referring Provider (SLP): Lavell Anchors, Elmer Sow, PA-C (referring)/Roberts, Windy Fast, MD (PCP)   Encounter Date: 04/30/2020   End of Session - 04/30/20 0928    Visit Number 8    Number of Visits 17    Date for SLP Re-Evaluation 05/15/20    SLP Start Time 0847    SLP Stop Time  0928    SLP Time Calculation (min) 41 min    Activity Tolerance Patient tolerated treatment well           Past Medical History:  Diagnosis Date  . Alcohol abuse   . Anxiety   . Atypical nevus 02/06/2015   Right Back - Mild  . Atypical nevus 04/30/2016   Right Chest - Mild  . Atypical nevus 04/30/2016   Right Shoulder - Mild  . Chronic back pain   . Drug abuse (HCC)   . Hypertension   . Lumbar disc disease   . Psoriasis     Past Surgical History:  Procedure Laterality Date  . APPENDECTOMY    . SHOULDER SURGERY Left     There were no vitals filed for this visit.   Subjective Assessment - 04/30/20 0850    Subjective "I spained my ankle at [employer]"    Currently in Pain? Yes    Pain Score 6     Pain Location Knee    Pain Orientation Left    Pain Descriptors / Indicators Aching    Pain Type Chronic pain    Pain Onset More than a month ago    Pain Frequency Intermittent                 ADULT SLP TREATMENT - 04/30/20 0851      General Information   Behavior/Cognition Alert;Cooperative;Pleasant mood      Treatment Provided   Treatment provided Cognitive-Linquistic      Cognitive-Linquistic Treatment   Treatment focused on Cognition;Patient/family/caregiver education    Skilled Treatment Pt reports the eye doctor cleared [name] to drive.  Pt is keeping notes on his phone and lists. He reports deleting lists  after he does not need them anymore. 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 He has not missed any meds since last ST visit and is successfully using caledars to manage appointments. He has a work calendar and home calendar and is adding appointments to both of these and checking them every day. Divided attention between attention to detail and conversation with usual averbal cues to ID errors made when he was talking. 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      Assessment / Recommendations / Plan   Plan Continue with current plan of care      Progression Toward Goals   Progression toward goals Progressing toward goals              SLP Short Term Goals - 04/30/20 0926      SLP SHORT TERM GOAL #1   Title Pt will use external aids/alerts to take PM medications 5/7 days.    Baseline 03/28/20    Status Achieved      SLP  SHORT TERM GOAL #2   Title Pt will establish system for organization/recall and bring to 3 therapy sessions.    Baseline 03-26-20, 04-02-20    Status Achieved      SLP SHORT TERM GOAL #3   Title Pt will demo appropriate use of compensations for attention, processing to recall >80% of details from mod complex paragraph (written or verbal presentation), x 3 sessions.    Baseline 04-20-20    Time 1    Period Weeks    Status Partially Met            SLP Long Term Goals - 04/30/20 4401      SLP LONG TERM GOAL #1   Title Pt will report no missed medications over 2 consecutive visits with use of external aids/alerts.    Baseline 03/28/20; 04/30/20    Time 5    Period Weeks    Status Achieved      SLP LONG TERM GOAL #2   Title Pt will demonstrate appropriate judgment, anticipatory awareness of tasks to avoid and accomodations necessary for workplace safety.     Baseline 04/30/20    Time 5    Period Weeks    Status On-going      SLP LONG TERM GOAL #3   Title Pt will use organization/recall system to manage appointments, finances and to dos independently over 3 sessions.    Baseline 04-20-20; 04/30/20    Time 5    Period Weeks    Status On-going      SLP LONG TERM GOAL #4   Title Pt will demo divided attention between 2 personally relevant tasks for 10 minutes maintaining >85% accuracy.    Time 5    Period Weeks    Status On-going            Plan - 04/30/20 0925    Clinical Impression Statement Mr. 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 He reports dificulty with memory as persisting, specifically remembering names and phone numbers and has a page in his notebook for this. 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 PT continues working on balance. I recommend skilled ST to maximize pt's cognitive abilities to ensure safety and improve ability to fulfill duties in the workplace and at home.    Speech Therapy Frequency 2x / week    Duration 8 weeks   17 visits   Treatment/Interventions Environmental controls;Language facilitation;Cueing hierarchy;SLP instruction and feedback;Cognitive reorganization;Compensatory techniques;Functional tasks;Compensatory strategies;Internal/external aids;Patient/family education    Potential to Achieve Goals Good           Patient will benefit from skilled therapeutic intervention in order to improve the following deficits and impairments:   Cognitive communication deficit    Problem List Patient Active Problem List   Diagnosis Date Noted  . Intracranial hemorrhage (HCC)   . Multiple closed facial bone fractures (HCC)   . Trauma   . Multiple trauma   . Traumatic brain injury with loss of  consciousness (HCC)   . Essential hypertension   . DDD (degenerative disc disease), lumbosacral   . Polysubstance abuse (HCC)   . SAH (subarachnoid hemorrhage) (HCC) 02/13/2020  . OSA (obstructive sleep apnea) 01/31/2020    Eryx Zane, Radene Journey MS, CCC-SLP 04/30/2020, 9:29 AM   Outpt Rehabilitation [facility] [address] [place], [place],  [address] Phone: [phone]   Fax:  [phone]   Name: JADE LANDSBERG MRN: [id number] Date of Birth: Mar 09, 1966

## 2020-04-30 NOTE — Therapy (Signed)
[facility] [address] [place], [place], [address] Phone: [phone]   Fax:  [phone]  Occupational Therapy Treatment  Patient Details  Name: Juan [name] MRN: [id number] Date of Birth: 02-Dec-1965 Referring Provider (OT): Alferd Apa PA-C   Encounter Date: 04/30/2020   OT End of Session - 04/30/20 1018    Visit Number 3    Number of Visits 25    Date for OT Re-Evaluation 06/15/20    Authorization Type BCBS    Authorization Time Period VL 30 sessions    OT Start Time 1017    OT Stop Time 1100    OT Time Calculation (min) 43 min    Activity Tolerance Patient tolerated treatment well    Behavior During Therapy [facility] for tasks assessed/performed           Past Medical History:  Diagnosis Date  . Alcohol abuse   . Anxiety   . Atypical nevus 02/06/2015   Right Back - Mild  . Atypical nevus 04/30/2016   Right Chest - Mild  . Atypical nevus 04/30/2016   Right Shoulder - Mild  . Chronic back pain   . Drug abuse ([place])   . Hypertension   . Lumbar disc disease   . Psoriasis     Past Surgical History:  Procedure Laterality Date  . APPENDECTOMY    . SHOULDER SURGERY Left     There were no vitals filed for this visit.   Subjective Assessment - 04/30/20 1018    Subjective  Pt reports good understanding and completion of his HEP this weekend. 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 Pt reports his eye is giving him trouble today and is painful (left)    Pertinent History PMHx includes afib, HTN, anxiety, hx drug and alcohol abuse.    Patient Stated Goals to see how i am doing    Currently in Pain? Yes    Pain Score 6     Pain Location Eye    Pain Orientation Left    Pain Descriptors / Indicators --   little pain but double vision   Pain Type Acute pain    Pain Onset More than a month ago    Pain Frequency --   worse today   Pain Relieving Factors sunglasses and rest                         OT Treatments/Exercises (OP) - 04/30/20 1025      ADLs   Driving pt reports continuing to drive but is not driving at night or evening and is wearing sunglasses during the day. Pt reports no issues for driving and safety at this time.      Exercises   Exercises Hand      Cognitive Exercises   Other Cognitive Exercises 1 Trail Making Test A & B with 100% accuracy and good speed      Hand Exercises   Other Hand Exercises Hand Gripper Level 3 with LUE and picking up 1 inch blocks      Visual/Perceptual Exercises   Scanning Environmental    Scanning - Environmental 13/14 and 93% accuracy in moderately distracting environment                    OT Short Term Goals - 04/30/20 1035      OT SHORT TERM GOAL #1   Title Pt will be independent  with HEP 04/20/20    Time 4    Period Weeks    Status On-going    Target Date 04/20/20      OT SHORT TERM GOAL #2   Title Pt will complete simple warm meal prep or home management tasks with supervision for increase in independence with ADLs and IADLs    Time 4    Period Weeks    Status On-going      OT SHORT TERM GOAL #3   Title Pt will perform environmental scanning in mod distracting environment with 80% accuracy    Time 4    Period Weeks    Status Achieved   93%     OT SHORT TERM GOAL #4   Title Pt will demonstrate improved functional LUE use by increasing Box and Blocks score by 7 blocks.    Baseline LUE 34    Time 4    Period Weeks    Status On-going      OT SHORT TERM GOAL #5   Title Pt will verblize understanding of return to driving recommendations for safety.    Time 4    Period Weeks    Status Achieved      OT SHORT TERM GOAL #6   Title Pt will demonstrate improved grip strength by increasing LUE grip strength to 54 lbs or greater.    Baseline LUE 49.3 lbs    Time 4    Period Weeks    Status On-going             OT Long Term Goals - 03/23/20 1253      OT LONG  TERM GOAL #1   Title Pt will be independent with updated HEP 06/15/2020    Time 12    Period Weeks    Status New    Target Date 06/15/20      OT LONG TERM GOAL #2   Title Pt will complete cooking task with following recipe or instructions with mod I for increase in independent with IADLs    Time 12    Period Weeks    Status New      OT LONG TERM GOAL #3   Title Pt will perform physical and cognitive task simultaneously in mod distracting environment with 90% accuracy    Time 12    Period Weeks    Status New      OT LONG TERM GOAL #4   Title Pt will demonstrate improved grip strength by increasing LUE grip strength to 62 lbs or greater    Baseline LUE 49.3    Time 12    Period Weeks    Status New      OT LONG TERM GOAL #5   Title Pt will demonstrate improved functional LUE use by increasing Box and Blocks score to 48 blocks or greater    Baseline LUE 34    Time 12    Period Weeks    Status New      OT LONG TERM GOAL #6   Title Pt will perform work simulated tasks with 90% accuracy to prepare for readiness for return to work.    Time 12    Period Weeks    Status New                 Plan - 04/30/20 1018    Clinical Impression Statement Pt reports good understanding and compliance with HEP. Pt did not recall evaluation with OT on 03/23/20 but recalled forgetting  readers from Friday.    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;Coordination;Flexibility;Mobility;Sensation;Vision;Endurance;IADL;ROM;UE functional use;GMC;Balance;ADL;Gait;Strength;Dexterity    Cognitive Skills Attention;Problem Solve;Safety Awareness;Sequencing;Understand;Memory;Perception;Thought;Learn    Rehab  Potential Good    Clinical Decision Making Limited treatment options, no task modification necessary    Comorbidities Affecting Occupational Performance: None    Modification or Assistance to Complete Evaluation  No modification of tasks or assist necessary to complete eval    OT Frequency 2x / week    OT Duration 12 weeks    OT Treatment/Interventions Patient/family education;Neuromuscular education;Functional Mobility Training;Visual/perceptual remediation/compensation;Cognitive remediation/compensation;Therapeutic exercise;Therapeutic activities;Balance training;DME and/or AE instruction;Self-care/ADL training;Moist Heat    Plan check to see how vision HEP is going, Assess grip strength and box and blocks    Recommended Other Services seeing PT and ST at this location    Consulted and Agree with Plan of Care Patient           Patient will benefit from skilled therapeutic intervention in order to improve the following deficits and impairments:   Body Structure / Function / Physical Skills: FMC,Coordination,Flexibility,Mobility,Sensation,Vision,Endurance,IADL,ROM,UE functional use,GMC,Balance,ADL,Gait,Strength,Dexterity Cognitive Skills: Attention,Problem Solve,Safety Awareness,Sequencing,Understand,Memory,Perception,Thought,Learn     Visit Diagnosis: Muscle weakness (generalized)  Other lack of coordination  Attention and concentration deficit  Visuospatial deficit  Frontal lobe and executive function deficit    Problem List Patient Active Problem List   Diagnosis Date Noted  . Intracranial hemorrhage ([place])   . Multiple closed facial bone fractures ([place])   . Trauma   . Multiple trauma   . Traumatic brain injury with loss of consciousness ([place])   . Essential hypertension   . DDD (degenerative disc disease), lumbosacral   . Polysubstance abuse ([place])   . SAH (subarachnoid hemorrhage) ([place]) 02/13/2020  . OSA (obstructive sleep apnea) 01/31/2020    Zachery Conch  MOT, OTR/L  04/30/2020, 11:03 AM  [facility] [address] [place], [place], [address] Phone: [phone]   Fax:  [phone]  Name: SUNDIATA FERRICK MRN: [id number] Date of Birth: [birth year]

## 2020-05-02 ENCOUNTER — Ambulatory Visit: Payer: 59 | Admitting: Physical Therapy

## 2020-05-02 ENCOUNTER — Ambulatory Visit: Payer: 59 | Admitting: Occupational Therapy

## 2020-05-02 ENCOUNTER — Ambulatory Visit: Payer: 59

## 2020-05-02 ENCOUNTER — Encounter: Payer: BC Managed Care – PPO | Admitting: Speech Pathology

## 2020-05-08 ENCOUNTER — Encounter: Payer: Self-pay | Admitting: Occupational Therapy

## 2020-05-08 ENCOUNTER — Ambulatory Visit: Payer: 59 | Admitting: Physical Therapy

## 2020-05-08 ENCOUNTER — Other Ambulatory Visit: Payer: Self-pay

## 2020-05-08 ENCOUNTER — Ambulatory Visit: Payer: 59

## 2020-05-08 ENCOUNTER — Encounter: Payer: Self-pay | Admitting: Physical Therapy

## 2020-05-08 ENCOUNTER — Ambulatory Visit: Payer: 59 | Admitting: Occupational Therapy

## 2020-05-08 DIAGNOSIS — R278 Other lack of coordination: Secondary | ICD-10-CM

## 2020-05-08 DIAGNOSIS — R41840 Attention and concentration deficit: Secondary | ICD-10-CM

## 2020-05-08 DIAGNOSIS — M6281 Muscle weakness (generalized): Secondary | ICD-10-CM

## 2020-05-08 DIAGNOSIS — R2681 Unsteadiness on feet: Secondary | ICD-10-CM

## 2020-05-08 DIAGNOSIS — R41841 Cognitive communication deficit: Secondary | ICD-10-CM

## 2020-05-08 DIAGNOSIS — R41844 Frontal lobe and executive function deficit: Secondary | ICD-10-CM

## 2020-05-08 DIAGNOSIS — R41842 Visuospatial deficit: Secondary | ICD-10-CM

## 2020-05-08 DIAGNOSIS — R2689 Other abnormalities of gait and mobility: Secondary | ICD-10-CM

## 2020-05-08 NOTE — Therapy (Signed)
[facility] [address] [place], [place], [address] Phone: [phone]   Fax:  [phone]  Speech Language Pathology Treatment  Patient Details  Name: Juan [name] MRN: [id number] Date of Birth: January 01, 1966 Referring Provider (SLP): Rodolph Bong, Barth Kirks, PA-C (referring)/Roberts, Jori Moll, MD (PCP)   Encounter Date: 05/08/2020   End of Session - 05/08/20 1105    Visit Number 9    Number of Visits 17    Date for SLP Re-Evaluation 05/15/20    SLP Start Time 58    SLP Stop Time  1052    SLP Time Calculation (min) 34 min    Activity Tolerance Patient tolerated treatment well           Past Medical History:  Diagnosis Date  . Alcohol abuse   . Anxiety   . Atypical nevus 02/06/2015   Right Back - Mild  . Atypical nevus 04/30/2016   Right Chest - Mild  . Atypical nevus 04/30/2016   Right Shoulder - Mild  . Chronic back pain   . Drug abuse ([place])   . Hypertension   . Lumbar disc disease   . Psoriasis     Past Surgical History:  Procedure Laterality Date  . APPENDECTOMY    . SHOULDER SURGERY Left     There were no vitals filed for this visit.   Subjective Assessment - 05/08/20 1021    Subjective "I forgot half my stuff."    Currently in Pain? Yes    Pain Score 6     Pain Location Ankle    Pain Orientation Right    Pain Descriptors / Indicators Aching;Sore    Pain Type Acute pain    Pain Onset 1 to 4 weeks ago    Pain Frequency Constant    Pain Score 6    Pain Location Knee    Pain Orientation Left    Pain Descriptors / Indicators Sore    Pain Type Chronic pain    Pain Onset More than a month ago    Pain Frequency Constant                 ADULT SLP TREATMENT - 05/08/20 1023      General Information   Behavior/Cognition Alert;Cooperative;Pleasant mood      Treatment Provided   Treatment provided Cognitive-Linquistic      Cognitive-Linquistic Treatment   Treatment focused on  Cognition;Patient/family/caregiver education    Skilled Treatment 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 Was tired and went to bed at about 1930. 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 Pt recalled about taking breaks but req'd cues for other compensations. Juan [name] reports continued calendars and lists for work and for personal items. Pt reported there are times rarely that he misses meds. "Sometimes I realize in the afternoon that I missed a medication in the morning, but it doesn't happen often." Pt suggested a pill organizer and SLP suggested an alarm on his phone to ensure successful medication adminstration. SLP wanted to assist pt with setting this up but pt left phone in his car. SLP asked pt to retrieve his phone and pt did so. SLP provided min A rarely for one alarm and pt was independent with min extra  time for the second alarm. He reasoned (exec function) why it would be good to leave snooze on. Pt and SLP will fill his med box together.      Assessment / Recommendations / Plan   Plan Continue with current plan of care      Progression Toward Goals   Progression toward goals Progressing toward goals            SLP Education - 05/08/20 1105    Education Details setting alarms for meds    Person(s) Educated Patient    Methods Explanation;Demonstration;Verbal cues    Comprehension Verbal cues required;Verbalized understanding;Returned demonstration            SLP Short Term Goals - 04/30/20 0926      SLP SHORT TERM GOAL #1   Title Pt will use external aids/alerts to take PM medications 5/7 days.    Baseline 03/28/20    Status Achieved      SLP SHORT TERM GOAL #2   Title Pt will establish system for organization/recall and bring to 3 therapy sessions.    Baseline  03-26-20, 04-02-20    Status Achieved      SLP SHORT TERM GOAL #3   Title Pt will demo appropriate use of compensations for attention, processing to recall >80% of details from mod complex paragraph (written or verbal presentation), x 3 sessions.    Baseline 04-20-20    Time 1    Period Weeks    Status Partially Met            SLP Long Term Goals - 05/08/20 1037      SLP LONG TERM GOAL #1   Title Pt will report no missed medications over 2 consecutive visits with use of external aids/alerts.    Baseline 03/28/20; 04/30/20,    Status Achieved      SLP LONG TERM GOAL #2   Title Pt will demonstrate appropriate judgment, anticipatory awareness of tasks to avoid and accomodations necessary for workplace safety.    Baseline 04/30/20, 05-08-20    Time 4    Period Weeks    Status On-going      SLP LONG TERM GOAL #3   Title Pt will use organization/recall system to manage appointments, finances and to [facility] independently over 3 sessions.    Baseline 04-20-20; 04/30/20, 05-08-20    Time 4    Period Weeks    Status On-going      SLP LONG TERM GOAL #4   Title Pt will demo divided attention between 2 personally relevant tasks for 10 minutes maintaining >85% accuracy.    Time 4    Period Weeks    Status On-going            Plan - 05/08/20 1043    Clinical Impression Statement Juan [name] presents with overall cont'd mild cognitive communication impairment secondary to TBI, with deficits in higher level attention, anticipatory awareness, memory and executive function. 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 From past session notes, reportedly pt has told other SLP that he has tracked med adminsitration succssfully but told this SLP today that he rarely misses AM med/s. Today SLP assisted pt in setting alarms in his phone for medication adminstration. Pt continues working on balance. I  recommend skilled ST to maximize pt's cognitive abilities to ensure safety and improve ability to fulfill duties in the workplace and at  home.    Speech Therapy Frequency 2x / week    Duration 8 weeks   17 visits   Treatment/Interventions Environmental controls;Language facilitation;Cueing hierarchy;SLP instruction and feedback;Cognitive reorganization;Compensatory techniques;Functional tasks;Compensatory strategies;Internal/external aids;Patient/family education    Potential to Achieve Goals Good           Patient will benefit from skilled therapeutic intervention in order to improve the following deficits and impairments:   Cognitive communication deficit    Problem List Patient Active Problem List   Diagnosis Date Noted  . Intracranial hemorrhage ([place])   . Multiple closed facial bone fractures ([place])   . Trauma   . Multiple trauma   . Traumatic brain injury with loss of consciousness ([place])   . Essential hypertension   . DDD (degenerative disc disease), lumbosacral   . Polysubstance abuse ([place])   . SAH (subarachnoid hemorrhage) ([place]) 02/13/2020  . OSA (obstructive sleep apnea) 01/31/2020    [facility] ,MS, CCC-SLP  05/08/2020, 11:06 AM  [facility] [address] [place], [place], [address] Phone: [phone]   Fax:  [phone]   Name: Juan [name] MRN: [id number] Date of Birth: [birth year]

## 2020-05-08 NOTE — Therapy (Signed)
[facility] [address] [place], [place], [address] Phone: [phone]   Fax:  [phone]  Occupational Therapy Treatment  Patient Details  Name: Juan [name] MRN: [id number] Date of Birth: 03/03/1966 Referring Provider (OT): Alferd Apa PA-C   Encounter Date: 05/08/2020   OT End of Session - 05/08/20 1116    Visit Number 4    Number of Visits 25    Date for OT Re-Evaluation 06/15/20    Authorization Type BCBS    Authorization Time Period VL 30 sessions    OT Start Time 1102    OT Stop Time 1145    OT Time Calculation (min) 43 min    Activity Tolerance Patient tolerated treatment well    Behavior During Therapy [facility] for tasks assessed/performed           Past Medical History:  Diagnosis Date  . Alcohol abuse   . Anxiety   . Atypical nevus 02/06/2015   Right Back - Mild  . Atypical nevus 04/30/2016   Right Chest - Mild  . Atypical nevus 04/30/2016   Right Shoulder - Mild  . Chronic back pain   . Drug abuse ([place])   . Hypertension   . Lumbar disc disease   . Psoriasis     Past Surgical History:  Procedure Laterality Date  . APPENDECTOMY    . SHOULDER SURGERY Left     There were no vitals filed for this visit.   Subjective Assessment - 05/08/20 1103    Subjective  Pt reports pain in his ankle and knee and his left shoulder.    Pertinent History PMHx includes afib, HTN, anxiety, hx drug and alcohol abuse.    Patient Stated Goals to see how i am doing    Currently in Pain? Yes    Pain Score 5     Pain Location Generalized   L knee, R ankle and L shoulder   Pain Orientation Right;Left    Pain Descriptors / Indicators Aching;Sore    Pain Onset More than a month ago    Pain Frequency Constant    Pain Relieving Factors rest                        OT Treatments/Exercises (OP) - 05/08/20 1120      ADLs   ADL Comments assessed some goals      Cognitive Exercises   Problem  Solving planning and organizing a day with a list of items and sequencing into order. Pt required increased time and did task with min assistance and 93% accuracy. Logic Links with following clues to determine order of chips. Pt completed 2 beginner level puzzles with one requiring no additional assistance and one requiring min to moderate assistance for mental flexibility and increased time. Moderate level puzzle with min assistance and increased time.                    OT Short Term Goals - 05/08/20 1108      OT SHORT TERM GOAL #1   Title Pt will be independent with HEP 04/20/20    Time 4    Period Weeks    Status On-going    Target Date 04/20/20      OT SHORT TERM GOAL #2   Title Pt will complete simple warm meal prep or home management tasks with supervision for increase in independence with ADLs and IADLs    Time 4  Period Weeks    Status On-going      OT SHORT TERM GOAL #3   Title Pt will perform environmental scanning in mod distracting environment with 80% accuracy    Time 4    Period Weeks    Status Achieved   93%     OT SHORT TERM GOAL #4   Title Pt will demonstrate improved functional LUE use by increasing Box and Blocks score by 7 blocks.    Baseline LUE 34    Time 4    Period Weeks    Status Achieved   42     OT SHORT TERM GOAL #5   Title Pt will verblize understanding of return to driving recommendations for safety.    Time 4    Period Weeks    Status Achieved      OT SHORT TERM GOAL #6   Title Pt will demonstrate improved grip strength by increasing LUE grip strength to 54 lbs or greater.    Baseline LUE 49.3 lbs    Time 4    Period Weeks    Status Achieved   77.6            OT Long Term Goals - 05/08/20 1106      OT LONG TERM GOAL #1   Title Pt will be independent with updated HEP 06/15/2020    Time 12    Period Weeks    Status New      OT LONG TERM GOAL #2   Title Pt will complete cooking task with following recipe or instructions  with mod I for increase in independent with IADLs    Time 12    Period Weeks    Status New      OT LONG TERM GOAL #3   Title Pt will perform physical and cognitive task simultaneously in mod distracting environment with 90% accuracy    Time 12    Period Weeks    Status New      OT LONG TERM GOAL #4   Title Pt will demonstrate improved grip strength by increasing LUE grip strength to 62 lbs or greater    Baseline LUE 49.3    Time 12    Period Weeks    Status Achieved   77.6     OT LONG TERM GOAL #5   Title Pt will demonstrate improved functional LUE use by increasing Box and Blocks score to 48 blocks or greater    Baseline LUE 34    Time 12    Period Weeks    Status On-going   42     OT LONG TERM GOAL #6   Title Pt will perform work simulated tasks with 90% accuracy to prepare for readiness for return to work.    Time 12    Period Weeks    Status New                 Plan - 05/08/20 1117    Clinical Impression Statement Pt continues to progress towards goals. Pt has met grip strength goal and demonstrates much improvement with LUE.    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;Coordination;Flexibility;Mobility;Sensation;Vision;Endurance;IADL;ROM;UE functional use;GMC;Balance;ADL;Gait;Strength;Dexterity    Cognitive Skills Attention;Problem Solve;Safety Awareness;Sequencing;Understand;Memory;Perception;Thought;Learn    Rehab Potential Good  Clinical Decision Making Limited treatment options, no task modification necessary    Comorbidities Affecting Occupational Performance: None    Modification or Assistance to Complete Evaluation  No modification of tasks or assist necessary to  complete eval    OT Frequency 2x / week    OT Duration 12 weeks    OT Treatment/Interventions Patient/family education;Neuromuscular education;Functional Mobility Training;Visual/perceptual remediation/compensation;Cognitive remediation/compensation;Therapeutic exercise;Therapeutic activities;Balance training;DME and/or AE instruction;Self-care/ADL training;Moist Heat    Plan functional problem solving tasks, cooking    Recommended Other Services seeing PT and ST at this location    Consulted and Agree with Plan of Care Patient           Patient will benefit from skilled therapeutic intervention in order to improve the following deficits and impairments:   Body Structure / Function / Physical Skills: FMC,Coordination,Flexibility,Mobility,Sensation,Vision,Endurance,IADL,ROM,UE functional use,GMC,Balance,ADL,Gait,Strength,Dexterity Cognitive Skills: Attention,Problem Solve,Safety Awareness,Sequencing,Understand,Memory,Perception,Thought,Learn     Visit Diagnosis: Muscle weakness (generalized)  Other lack of coordination  Unsteadiness on feet  Attention and concentration deficit  Visuospatial deficit  Frontal lobe and executive function deficit    Problem List Patient Active Problem List   Diagnosis Date Noted  . Intracranial hemorrhage ([place])   . Multiple closed facial bone fractures ([place])   . Trauma   . Multiple trauma   . Traumatic brain injury with loss of consciousness ([place])   . Essential hypertension   . DDD (degenerative disc disease), lumbosacral   . Polysubstance abuse ([place])   . SAH (subarachnoid hemorrhage) ([place]) 02/13/2020  . OSA (obstructive sleep apnea) 01/31/2020    Zachery Conch MOT, OTR/L  05/08/2020, 11:36 AM  [facility] [address] [place], [place], [address] Phone: [phone]   Fax:  [phone]  Name: Juan [name] MRN: [id number] Date of Birth: 08-Nov-1965

## 2020-05-08 NOTE — Patient Instructions (Signed)
Bring your meds and your new med box Thursday and we will fill it up.

## 2020-05-08 NOTE — Therapy (Signed)
[facility] [address] [place], [place], [address] Phone: [phone]   Fax:  [phone]  Physical Therapy Treatment  Patient Details  Name: Juan [name] MRN: [id number] Date of Birth: [birth year] Referring Provider (PT): Jillyn Ledger, [place]   Encounter Date: 05/08/2020   PT End of Session - 05/08/20 1426    Visit Number 6    Number of Visits 13    Date for PT Re-Evaluation 05/25/20    Authorization Type BCBS - 30 VL for PT and OT, each counts as 1 visit    Authorization - Visit Number 6    Authorization - Number of Visits 30    PT Start Time 0930    PT Stop Time 1011    PT Time Calculation (min) 41 min    Equipment Utilized During Treatment Gait belt    Activity Tolerance Patient tolerated treatment well    Behavior During Therapy [facility] for tasks assessed/performed           Past Medical History:  Diagnosis Date   Alcohol abuse    Anxiety    Atypical nevus 02/06/2015   Right Back - Mild   Atypical nevus 04/30/2016   Right Chest - Mild   Atypical nevus 04/30/2016   Right Shoulder - Mild   Chronic back pain    Drug abuse ([place])    Hypertension    Lumbar disc disease    Psoriasis     Past Surgical History:  Procedure Laterality Date   APPENDECTOMY     SHOULDER SURGERY Left     There were no vitals filed for this visit.   Subjective Assessment - 05/08/20 0931    Subjective Was feeling really worn out last week. Ankle is feeling better, not as swollen. Has tried the new exercises and reports that they are going ok.    Pertinent History PMHx includes afib, HTN, anxiety, hx drug and alcohol abuse.    Limitations Standing;Walking    Patient Stated Goals improve balance              [facility] PT Assessment - 05/08/20 0933      Ambulation/Gait   Gait velocity 8.37 seconds = 3.91 ft/sec      Functional Gait  Assessment   Gait assessed  Yes    Gait Level Surface Walks 20 ft in  less than 5.5 sec, no assistive devices, good speed, no evidence for imbalance, normal gait pattern, deviates no more than 6 in outside of the 12 in walkway width.   5.5 seconds   Change in Gait Speed Able to smoothly change walking speed without loss of balance or gait deviation. Deviate no more than 6 in outside of the 12 in walkway width.    Gait with Horizontal Head Turns Performs head turns smoothly with slight change in gait velocity (eg, minor disruption to smooth gait path), deviates 6-10 in outside 12 in walkway width, or uses an assistive device.    Gait with Vertical Head Turns Performs head turns with no change in gait. Deviates no more than 6 in outside 12 in walkway width.    Gait and Pivot Turn Pivot turns safely within 3 sec and stops quickly with no loss of balance.    Step Over Obstacle Is able to step over 2 stacked shoe boxes taped together (9 in total height) without changing gait speed. No evidence of imbalance.    Gait with Narrow Base of Support Is able to  ambulate for 10 steps heel to toe with no staggering.    Gait with Eyes Closed Walks 20 ft, uses assistive device, slower speed, mild gait deviations, deviates 6-10 in outside 12 in walkway width. Ambulates 20 ft in less than 9 sec but greater than 7 sec.    Ambulating Backwards Walks 20 ft, no assistive devices, good speed, no evidence for imbalance, normal gait    Steps Alternating feet, no rail.    Total Score 28    FGA comment: 28/30                         [facility] Adult PT Treatment/Exercise - 05/08/20 0933      Ambulation/Gait   Ambulation/Gait Yes    Ambulation/Gait Assistance 5: Supervision    Assistive device None    Gait Pattern Step-through pattern;Decreased arm swing - left;Decreased arm swing - right    Ambulation Surface Level;Indoor               Balance Exercises - 05/08/20 0001      Balance Exercises: Standing   Standing Eyes Closed Narrow base of support (BOS);Foam/compliant  surface    Standing Eyes Closed Limitations 3 x 30 seconds, with feet together 3 x 5 reps head nods, 3 x 5 reps head turns intermittent touch to wall for balance    Rockerboard Anterior/posterior;Limitations    Rockerboard Limitations in A/P direction - rocking board gently forward and back 3 x 30 seconds with eyes closed    Tandem Gait Forward;Foam/compliant surface;Intermittent upper extremity support;Limitations    Tandem Gait Limitations down and back 2 reps with tapping heel first before tandem gait on blue foam beam - intermittent UE support, down and back 2 reps with head turns R/L    Sidestepping Foam/compliant support;Limitations    Sidestepping Limitations holding mini squat position side steps down and back 2 reps, cues for incr foot clearance    Other Standing Exercises SLS: able to hold at least 20 seconds B             PT Education - 05/08/20 1426    Education Details progress towards goals.    Person(s) Educated Patient    Methods Explanation    Comprehension Verbalized understanding            PT Short Term Goals - 04/23/20 1121      PT SHORT TERM GOAL #1   Title Pt will be independent with initial HEP in order to build upon functional gains made in therapy. ALL STGS DUE 04/16/20    Baseline pt reports doing them at home when he can - recently hasn't been able to because of his R ankle    Time 3    Period Weeks    Status Partially Met    Target Date 04/16/20      PT SHORT TERM GOAL #2   Title Perform 6MWT with LTG to be written as appropriate.    Baseline 1,135' on 03/28/20    Time 3    Period Weeks    Status Achieved      PT SHORT TERM GOAL #3   Title Pt will decr 5x sit <> stand time to 17 seconds or less with no UE support in order to demo improved functional BLE strength.    Baseline 21.38 seconds, 13.59 seconds with no UE support    Time 3    Period Weeks    Status Achieved  PT SHORT TERM GOAL #4   Title Pt will improve FGA score to at least  a 22/30 in order to demo decr fall risk.    Baseline 19/30, 25/30 on 04/23/20    Time 3    Period Weeks    Status Achieved             PT Long Term Goals - 05/08/20 3887      PT LONG TERM GOAL #1   Title Pt will be independent with final HEP in order to build upon functional gains made in therapy. ALL LTGS DUE 05/07/20    Time 6    Period Weeks    Status New      PT LONG TERM GOAL #2   Title Pt will improve 6MWT by 150' to demo improved walking endurance.    Baseline 1,135' on 03/28/20    Time 6    Period Weeks    Status Revised      PT LONG TERM GOAL #3   Title Pt will improve FGA score to at least a 28/30 in order to demo decr fall risk.    Baseline 25/30 on 04/23/20, 28/30 on 05/08/20    Time 6    Period Weeks    Status Revised      PT LONG TERM GOAL #4   Title Pt will ambulate 1,000' over outdoor unlevel surfaces with supervision in order to demo improved community mobility.    Time 6    Period Weeks    Status New      PT LONG TERM GOAL #5   Title Pt will improve gait speed to at least 3.5 ft/sec for improved gait efficiency.    Baseline 3.05 ft/sec, 8.37 seconds = 3.91 ft/sec    Time 6    Period Weeks    Status Achieved               Plan - 05/08/20 1429    Clinical Impression Statement Began to assess pt's LTGs - pt meeting LTG #3 and #5 - pt improved FGA score to a 28/30 (previously 25/30) and pt improved his gait speed to 3.91 ft/sec (previously 3.05 ft/sec). Remainder of session focused on balance strategies on compliant surfaces with vision removed and narrow BOS. Pt needing intermittent seated rest breaks due to fatigue.   Will continue to progress towards LTGs.    Personal Factors and Comorbidities Comorbidity 3+;Past/Current Experience;Profession    Comorbidities afib, HTN, anxiety, hx drug and alcohol abuse.    Examination-Activity Limitations Locomotion Level;Stairs;Transfers    Examination-Participation Restrictions Community Activity;Occupation     Stability/Clinical Decision Making Stable/Uncomplicated    Rehab Potential Good    PT Frequency 2x / week    PT Duration 6 weeks    PT Treatment/Interventions ADLs/Self Care Home Management;Gait training;Stair training;Therapeutic activities;Neuromuscular re-education;Balance training;Therapeutic exercise;Patient/family education;Vestibular    PT Next Visit Plan begin checking remainder of LTGs. cog dual tasking with gait/balance.  high level balance with vestibular input, tandem, SLS.    Consulted and Agree with Plan of Care Patient           Patient will benefit from skilled therapeutic intervention in order to improve the following deficits and impairments:  Abnormal gait,Decreased activity tolerance,Decreased balance,Decreased endurance,Decreased strength,Difficulty walking,Dizziness  Visit Diagnosis: Muscle weakness (generalized)  Other lack of coordination  Unsteadiness on feet  Other abnormalities of gait and mobility     Problem List Patient Active Problem List   Diagnosis Date Noted   Intracranial  hemorrhage ([place])    Multiple closed facial bone fractures ([place])    Trauma    Multiple trauma    Traumatic brain injury with loss of consciousness ([place])    Essential hypertension    DDD (degenerative disc disease), lumbosacral    Polysubstance abuse ([place])    SAH (subarachnoid hemorrhage) ([place]) 02/13/2020   OSA (obstructive sleep apnea) 01/31/2020    Arliss Journey, PT, DPT  05/08/2020, 2:30 PM  [facility] [address] [place], [place], [address] Phone: [phone]   Fax:  [phone]  Name: Juan [name] MRN: [id number] Date of Birth: [birth year]

## 2020-05-10 ENCOUNTER — Ambulatory Visit: Payer: 59

## 2020-05-10 ENCOUNTER — Other Ambulatory Visit: Payer: Self-pay

## 2020-05-10 ENCOUNTER — Encounter: Payer: Self-pay | Admitting: Occupational Therapy

## 2020-05-10 ENCOUNTER — Ambulatory Visit: Payer: 59 | Admitting: Occupational Therapy

## 2020-05-10 DIAGNOSIS — R278 Other lack of coordination: Secondary | ICD-10-CM

## 2020-05-10 DIAGNOSIS — R41841 Cognitive communication deficit: Secondary | ICD-10-CM

## 2020-05-10 DIAGNOSIS — R41844 Frontal lobe and executive function deficit: Secondary | ICD-10-CM

## 2020-05-10 DIAGNOSIS — R2681 Unsteadiness on feet: Secondary | ICD-10-CM

## 2020-05-10 DIAGNOSIS — R41842 Visuospatial deficit: Secondary | ICD-10-CM

## 2020-05-10 DIAGNOSIS — M6281 Muscle weakness (generalized): Secondary | ICD-10-CM

## 2020-05-10 DIAGNOSIS — R2689 Other abnormalities of gait and mobility: Secondary | ICD-10-CM

## 2020-05-10 DIAGNOSIS — R41840 Attention and concentration deficit: Secondary | ICD-10-CM

## 2020-05-10 NOTE — Therapy (Signed)
[facility] [address] [place], [place], [address] Phone: [phone]   Fax:  [phone]  Speech Language Pathology Treatment/Progress Note  Patient Details  Name: Juan [name] MRN: [id number] Date of Birth: 05-25-1965 Referring Provider (SLP): Rodolph Bong, [place], PA-C (referring)/Roberts, Jori Moll, MD (PCP)   Encounter Date: 05/10/2020   End of Session - 05/10/20 1314    Visit Number 10    Number of Visits 17    Date for SLP Re-Evaluation 06/22/20    SLP Start Time 0851    SLP Stop Time  0931    SLP Time Calculation (min) 40 min    Activity Tolerance Patient tolerated treatment well           Past Medical History:  Diagnosis Date  . Alcohol abuse   . Anxiety   . Atypical nevus 02/06/2015   Right Back - Mild  . Atypical nevus 04/30/2016   Right Chest - Mild  . Atypical nevus 04/30/2016   Right Shoulder - Mild  . Chronic back pain   . Drug abuse ([place])   . Hypertension   . Lumbar disc disease   . Psoriasis     Past Surgical History:  Procedure Laterality Date  . APPENDECTOMY    . SHOULDER SURGERY Left     There were no vitals filed for this visit.   Subjective Assessment - 05/10/20 1257    Subjective Pt brought meds and med boxes (two week-long boxes)    Currently in Pain? Yes    Pain Score 6     Pain Location Generalized    Pain Orientation Right;Left    Pain Descriptors / Indicators Aching;Sore    Pain Type Chronic pain    Pain Onset More than a month ago    Pain Frequency Constant                 ADULT SLP TREATMENT - 05/10/20 0912      General Information   Behavior/Cognition Alert;Cooperative;Pleasant mood      Treatment Provided   Treatment provided Cognitive-Linquistic      Cognitive-Linquistic Treatment   Treatment focused on Cognition;Patient/family/caregiver education    Skilled Treatment Pt brought meds and med box/es today and marked one with sharpie "PM". Pt  told SLP he needs refills on his two controlled substances Xanax and hydrocodone. did not appear to have a plan in place for calling MD to get refills. Ptstated he would call during his break in tx for the refills. 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      Assessment / Recommendations / Plan   Plan --   reduce to once/week     Progression Toward Goals   Progression toward goals Progressing toward goals              SLP Short Term Goals - 04/30/20 0926      SLP SHORT TERM GOAL #1   Title Pt will use external aids/alerts to take PM medications 5/7 days.    Baseline 03/28/20    Status Achieved      SLP SHORT TERM GOAL #2   Title Pt will establish system for organization/recall and bring to 3 therapy sessions.    Baseline 03-26-20, 04-02-20    Status Achieved      SLP SHORT TERM GOAL #3   Title Pt will demo appropriate use of compensations for attention, processing to recall >  80% of details from mod complex paragraph (written or verbal presentation), x 3 sessions.    Baseline 04-20-20    Time 1    Period Weeks    Status Partially Met            SLP Long Term Goals - 05/10/20 0919      SLP LONG TERM GOAL #1   Title Pt will report no missed medications over 2 consecutive visits with use of external aids/alerts.    Baseline 03/28/20; 04/30/20,    Status Achieved      SLP LONG TERM GOAL #2   Title Pt will demonstrate appropriate judgment, anticipatory awareness of tasks to avoid and accomodations necessary for workplace safety.    Baseline 04/30/20, 05-08-20, 05-10-20    Status Achieved      SLP LONG TERM GOAL #3   Title Pt will use organization/recall system to manage appointments, finances and to [facility] independently over 3 sessions.    Baseline 04-20-20; 04/30/20, 05-08-20    Status Achieved      SLP LONG TERM GOAL #4   Title Pt will demo divided attention between 2 personally  relevant tasks for 10 minutes maintaining >85% accuracy.    Time 4    Period Weeks    Status On-going            Plan - 05/10/20 1315    Clinical Impression Statement Juan [name] presents with overall cont'd mild and resolving cognitive communication impairment secondary to TBI, with improving deficits in higher level attention, anticipatory awareness, memory and executive function. He has met almost all his LTGs at this time and will be reduced to once/week. He reports dificulty with memory as persisting, but reports he is using memory compensations successfully. 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 Pt continues working on balance. I recommend skilled ST to maximize pt's cognitive abilities to ensure safety and improve ability to fulfill duties in the workplace and at home.    Speech Therapy Frequency 1x /week    Duration --   17 visits total   Treatment/Interventions Environmental controls;Language facilitation;Cueing hierarchy;SLP instruction and feedback;Cognitive reorganization;Compensatory techniques;Functional tasks;Compensatory strategies;Internal/external aids;Patient/family education    Potential to Achieve Goals Good           Patient will benefit from skilled therapeutic intervention in order to improve the following deficits and impairments:   Cognitive communication deficit    Problem List Patient Active Problem List   Diagnosis Date Noted  . Intracranial hemorrhage ([place])   . Multiple closed facial bone fractures ([place])   . Trauma   . Multiple trauma   . Traumatic brain injury with loss of consciousness ([place])   . Essential hypertension   . DDD (degenerative disc disease), lumbosacral   . Polysubstance abuse ([place])   . SAH (subarachnoid hemorrhage) ([place]) 02/13/2020  . OSA (obstructive sleep apnea) 01/31/2020    [facility] ,MS, CCC-SLP  05/10/2020, 1:18 PM  [facility] [address] [place], [place], [address] Phone: [phone]   Fax:  [phone]   Name: Juan [name] MRN: [id number] Date of Birth: [birth year]

## 2020-05-10 NOTE — Therapy (Signed)
[facility] [address] [place], [place], [address] Phone: [phone]   Fax:  [phone]  Occupational Therapy Treatment  Patient Details  Name: Juan [name] MRN: [id number] Date of Birth: 02/03/1966 Referring Provider (OT): Maczis, Michael PA-C   Encounter Date: 05/10/2020   OT End of Session - 05/10/20 1018    Visit Number 5    Number of Visits 25    Date for OT Re-Evaluation 06/15/20    Authorization Type BCBS    Authorization Time Period VL 30 sessions    OT Start Time 1017    OT Stop Time 1100    OT Time Calculation (min) 43 min    Activity Tolerance Patient tolerated treatment well    Behavior During Therapy WFL for tasks assessed/performed           Past Medical History:  Diagnosis Date  . Alcohol abuse   . Anxiety   . Atypical nevus 02/06/2015   Right Back - Mild  . Atypical nevus 04/30/2016   Right Chest - Mild  . Atypical nevus 04/30/2016   Right Shoulder - Mild  . Chronic back pain   . Drug abuse ([facility])   . Hypertension   . Lumbar disc disease   . Psoriasis     Past Surgical History:  Procedure Laterality Date  . APPENDECTOMY    . SHOULDER SURGERY Left     There were no vitals filed for this visit.   Subjective Assessment - 05/10/20 1018    Subjective  Pt making progress towards goals. Pt has met all STGs and is working towards LTGs.    Pertinent History PMHx includes afib, HTN, anxiety, hx drug and alcohol abuse.    Patient Stated Goals to see how i am doing    Currently in Pain? Yes    Pain Score 6     Pain Location Generalized   knee and leg on right side, left shoulder   Pain Orientation Right;Left    Pain Descriptors / Indicators Aching;Sore    Pain Type Acute pain    Pain Onset More than a month ago    Pain Frequency Constant    Pain Relieving Factors rest                        OT Treatments/Exercises (OP) - 05/10/20 1103      ADLs   Cooking  cooked and followed recipe on instructions on back of rice packet with mod I this day      Cognitive Exercises   Attention Span Alternating 90% accuracy for physical/cognitive task of tossing ball while ambulating around clinic and word finding for fruits/vegetables and foods in alphabetical order. Incresed time required                    OT Short Term Goals - 05/10/20 1033      OT SHORT TERM GOAL #1   Title Pt will be independent with HEP 04/20/20    Time 4    Period Weeks    Status Achieved    Target Date 04/20/20      OT SHORT TERM GOAL #2   Title Pt will complete simple warm meal prep or home management tasks with supervision for increase in independence with ADLs and IADLs    Time 4    Period Weeks    Status Achieved      OT SHORT TERM GOAL #3     Title Pt will perform environmental scanning in mod distracting environment with 80% accuracy    Time 4    Period Weeks    Status Achieved   93%     OT SHORT TERM GOAL #4   Title Pt will demonstrate improved functional LUE use by increasing Box and Blocks score by 7 blocks.    Baseline LUE 34    Time 4    Period Weeks    Status Achieved   42     OT SHORT TERM GOAL #5   Title Pt will verblize understanding of return to driving recommendations for safety.    Time 4    Period Weeks    Status Achieved      OT SHORT TERM GOAL #6   Title Pt will demonstrate improved grip strength by increasing LUE grip strength to 54 lbs or greater.    Baseline LUE 49.3 lbs    Time 4    Period Weeks    Status Achieved   77.6            OT Long Term Goals - 05/10/20 1034      OT LONG TERM GOAL #1   Title Pt will be independent with updated HEP 06/15/2020    Time 12    Period Weeks    Status On-going      OT LONG TERM GOAL #2   Title Pt will complete cooking task with following recipe or instructions with mod I for increase in independent with IADLs    Time 12    Period Weeks    Status Achieved      OT LONG TERM GOAL #3    Title Pt will perform physical and cognitive task simultaneously in mod distracting environment with 90% accuracy    Time 12    Period Weeks    Status Achieved   increased time and approximately 90% accuracy     OT LONG TERM GOAL #4   Title Pt will demonstrate improved grip strength by increasing LUE grip strength to 62 lbs or greater    Baseline LUE 49.3    Time 12    Period Weeks    Status Achieved   77.6     OT LONG TERM GOAL #5   Title Pt will demonstrate improved functional LUE use by increasing Box and Blocks score to 48 blocks or greater    Baseline LUE 34    Time 12    Period Weeks    Status On-going   42     OT LONG TERM GOAL #6   Title Pt will perform work simulated tasks with 90% accuracy to prepare for readiness for return to work.    Time 12    Period Weeks    Status On-going                 Plan - 05/10/20 1021    Clinical Impression Statement Pt has met STGs and is working towards meeting all LTGs.    OT Occupational Profile and History Detailed Assessment- Review of Records and additional review of physical, cognitive, psychosocial history related to current functional performance    Occupational performance deficits (Please refer to evaluation for details): IADL's;ADL's;Play;Work    Body Structure / Function / Physical Skills FMC;Coordination;Flexibility;Mobility;Sensation;Vision;Endurance;IADL;ROM;UE functional use;GMC;Balance;ADL;Gait;Strength;Dexterity    Cognitive Skills Attention;Problem Solve;Safety Awareness;Sequencing;Understand;Memory;Perception;Thought;Learn    Rehab Potential Good    Clinical Decision Making Limited treatment options, no task modification necessary    Comorbidities Affecting Occupational   Performance: None    Modification or Assistance to Complete Evaluation  No modification of tasks or assist necessary to complete eval    OT Frequency 2x / week    OT Duration 12 weeks    OT Treatment/Interventions Patient/family  education;Neuromuscular education;Functional Mobility Training;Visual/perceptual remediation/compensation;Cognitive remediation/compensation;Therapeutic exercise;Therapeutic activities;Balance training;DME and/or AE instruction;Self-care/ADL training;Moist Heat    Plan functional problem solving tasks, assess Box and Blocks    Recommended Other Services seeing PT and ST at this location    Consulted and Agree with Plan of Care Patient           Patient will benefit from skilled therapeutic intervention in order to improve the following deficits and impairments:   Body Structure / Function / Physical Skills: FMC,Coordination,Flexibility,Mobility,Sensation,Vision,Endurance,IADL,ROM,UE functional use,GMC,Balance,ADL,Gait,Strength,Dexterity Cognitive Skills: Attention,Problem Solve,Safety Awareness,Sequencing,Understand,Memory,Perception,Thought,Learn     Visit Diagnosis: Muscle weakness (generalized)  Other lack of coordination  Unsteadiness on feet  Other abnormalities of gait and mobility  Attention and concentration deficit  Visuospatial deficit  Frontal lobe and executive function deficit    Problem List Patient Active Problem List   Diagnosis Date Noted  . Intracranial hemorrhage ([place])   . Multiple closed facial bone fractures ([place])   . Trauma   . Multiple trauma   . Traumatic brain injury with loss of consciousness ([place])   . Essential hypertension   . DDD (degenerative disc disease), lumbosacral   . Polysubstance abuse ([place])   . SAH (subarachnoid hemorrhage) ([place]) 02/13/2020  . OSA (obstructive sleep apnea) 01/31/2020    Zachery Conch MOT, OTR/L  05/10/2020, 11:04 AM  [facility] [address] [place], [place], [address] Phone: [phone]   Fax:  [phone]  Name: LIAM CAMMARATA MRN: [id number] Date of Birth: Feb 10, 1966

## 2020-05-13 ENCOUNTER — Encounter: Payer: Self-pay | Admitting: Dermatology
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
## 2020-05-14 ENCOUNTER — Encounter: Payer: BC Managed Care – PPO | Admitting: Speech Pathology

## 2020-05-15 ENCOUNTER — Ambulatory Visit: Payer: 59 | Admitting: Physical Therapy

## 2020-05-15 ENCOUNTER — Ambulatory Visit: Payer: 59

## 2020-05-15 ENCOUNTER — Ambulatory Visit: Payer: 59 | Admitting: Occupational Therapy

## 2020-05-16 ENCOUNTER — Other Ambulatory Visit: Payer: Self-pay | Admitting: *Deleted

## 2020-05-16 DIAGNOSIS — L400 Psoriasis vulgaris: Secondary | ICD-10-CM

## 2020-05-16 MED ORDER — OTEZLA 30 MG PO TABS: 30.0000 mg | ORAL_TABLET | Freq: Two times a day (BID) | ORAL | 2 refills | Status: DC

## 2020-05-17 ENCOUNTER — Ambulatory Visit: Payer: 59 | Admitting: Occupational Therapy

## 2020-05-17 ENCOUNTER — Other Ambulatory Visit: Payer: Self-pay

## 2020-05-17 ENCOUNTER — Ambulatory Visit: Payer: 59 | Admitting: Physical Therapy

## 2020-05-17 ENCOUNTER — Encounter: Payer: Self-pay | Admitting: Physical Therapy

## 2020-05-17 ENCOUNTER — Ambulatory Visit: Payer: 59

## 2020-05-17 ENCOUNTER — Encounter: Payer: Self-pay | Admitting: Occupational Therapy

## 2020-05-17 DIAGNOSIS — R2689 Other abnormalities of gait and mobility: Secondary | ICD-10-CM

## 2020-05-17 DIAGNOSIS — R41842 Visuospatial deficit: Secondary | ICD-10-CM

## 2020-05-17 DIAGNOSIS — R278 Other lack of coordination: Secondary | ICD-10-CM

## 2020-05-17 DIAGNOSIS — M6281 Muscle weakness (generalized): Secondary | ICD-10-CM

## 2020-05-17 DIAGNOSIS — R41840 Attention and concentration deficit: Secondary | ICD-10-CM

## 2020-05-17 DIAGNOSIS — R41844 Frontal lobe and executive function deficit: Secondary | ICD-10-CM

## 2020-05-17 DIAGNOSIS — R2681 Unsteadiness on feet: Secondary | ICD-10-CM

## 2020-05-17 DIAGNOSIS — R41841 Cognitive communication deficit: Secondary | ICD-10-CM | POA: Diagnosis not present

## 2020-05-17 NOTE — Therapy (Signed)
10:04 AM  [facility] [address] [place], [place], [address] Phone: [phone]   Fax:  [phone]   Name: Juan [name] MRN: [id number] Date of Birth: 22-Dec-1965  [facility] [address] [place], [place], [address] Phone: [phone]   Fax:  [phone]  Speech Language Pathology Treatment  Patient Details  Name: Juan [name] MRN: [id number] Date of Birth: 1965/11/29 Referring Provider (SLP): Rodolph Bong, Barth Kirks, PA-C (referring)/Roberts, Jori Moll, MD (PCP)   Encounter Date: 05/17/2020   End of Session - 05/17/20 0920    Visit Number 11    Number of Visits 17    Date for SLP Re-Evaluation 06/22/20    SLP Start Time 0855    SLP Stop Time  0930    SLP Time Calculation (min) 35 min    Activity Tolerance Patient tolerated treatment well           Past Medical History:  Diagnosis Date  . Alcohol abuse   . Anxiety   . Atypical nevus 02/06/2015   Right Back - Mild  . Atypical nevus 04/30/2016   Right Chest - Mild  . Atypical nevus 04/30/2016   Right Shoulder - Mild  . Chronic back pain   . Drug abuse ([place])   . Hypertension   . Lumbar disc disease   . Psoriasis     Past Surgical History:  Procedure Laterality Date  . APPENDECTOMY    . SHOULDER SURGERY Left     There were no vitals filed for this visit.   Subjective Assessment - 05/17/20 0858    Subjective "Sorry I'm late - time snuck up on me." Been moving my stuff out of my office last 3 days.    Currently in Pain? Yes    Pain Score 5     Pain Location Knee    Pain Orientation Left;Right    Multiple Pain Sites Yes    Pain Score 7    Pain Location Foot    Pain Orientation Right    Pain Score 6    Pain Location Shoulder    Pain Orientation Left                 ADULT SLP TREATMENT - 05/17/20 0909      General Information   Behavior/Cognition Alert;Cooperative;Pleasant mood      Treatment Provided   Treatment provided Cognitive-Linquistic      Cognitive-Linquistic Treatment   Treatment focused on Cognition;Patient/family/caregiver education    Skilled Treatment "Even my sister knows that  writing things down is better for me." Pt reports that he is much more organized than when he started ST - endorsed that his phone and his notebook have helped, the med box, and the tips and strategies we have provided for him have all been helpful. Pt states vision is a major issue in getting back to work along with attention.Pt has begun to make a plan for the following day and sticking to it -this has helped organization/planning. In divided attention tasks today (simple visual and auditory tasks) pt's attention to detail req'd cues. Pt told SLP the main compensation he would use is NOT to do divided attention tasks but do one until ocmpletion and then the other. SLP confirmed this with pt. Pt and SLP agreed pt is 2-4 visits from d/c.      Assessment / Recommendations / Plan   Plan --   decr to once/week     Progression Toward Goals   Progression toward goals Progressing toward goals              SLP Short Term Goals - 04/30/20 6226  [facility] [address] [place], [place], [address] Phone: [phone]   Fax:  [phone]  Speech Language Pathology Treatment  Patient Details  Name: Juan [name] MRN: [id number] Date of Birth: 1965/11/29 Referring Provider (SLP): Rodolph Bong, Barth Kirks, PA-C (referring)/Roberts, Jori Moll, MD (PCP)   Encounter Date: 05/17/2020   End of Session - 05/17/20 0920    Visit Number 11    Number of Visits 17    Date for SLP Re-Evaluation 06/22/20    SLP Start Time 0855    SLP Stop Time  0930    SLP Time Calculation (min) 35 min    Activity Tolerance Patient tolerated treatment well           Past Medical History:  Diagnosis Date  . Alcohol abuse   . Anxiety   . Atypical nevus 02/06/2015   Right Back - Mild  . Atypical nevus 04/30/2016   Right Chest - Mild  . Atypical nevus 04/30/2016   Right Shoulder - Mild  . Chronic back pain   . Drug abuse ([place])   . Hypertension   . Lumbar disc disease   . Psoriasis     Past Surgical History:  Procedure Laterality Date  . APPENDECTOMY    . SHOULDER SURGERY Left     There were no vitals filed for this visit.   Subjective Assessment - 05/17/20 0858    Subjective "Sorry I'm late - time snuck up on me." Been moving my stuff out of my office last 3 days.    Currently in Pain? Yes    Pain Score 5     Pain Location Knee    Pain Orientation Left;Right    Multiple Pain Sites Yes    Pain Score 7    Pain Location Foot    Pain Orientation Right    Pain Score 6    Pain Location Shoulder    Pain Orientation Left                 ADULT SLP TREATMENT - 05/17/20 0909      General Information   Behavior/Cognition Alert;Cooperative;Pleasant mood      Treatment Provided   Treatment provided Cognitive-Linquistic      Cognitive-Linquistic Treatment   Treatment focused on Cognition;Patient/family/caregiver education    Skilled Treatment "Even my sister knows that  writing things down is better for me." Pt reports that he is much more organized than when he started ST - endorsed that his phone and his notebook have helped, the med box, and the tips and strategies we have provided for him have all been helpful. Pt states vision is a major issue in getting back to work along with attention.Pt has begun to make a plan for the following day and sticking to it -this has helped organization/planning. In divided attention tasks today (simple visual and auditory tasks) pt's attention to detail req'd cues. Pt told SLP the main compensation he would use is NOT to do divided attention tasks but do one until ocmpletion and then the other. SLP confirmed this with pt. Pt and SLP agreed pt is 2-4 visits from d/c.      Assessment / Recommendations / Plan   Plan --   decr to once/week     Progression Toward Goals   Progression toward goals Progressing toward goals              SLP Short Term Goals - 04/30/20 6226

## 2020-05-17 NOTE — Therapy (Signed)
[facility] [address] [place], [place], [address] Phone: [phone]   Fax:  [phone]  Physical Therapy Treatment/Discharge Summary  Patient Details  Name: Juan [name] MRN: [id number] Date of Birth: 1965/08/22 Referring Provider (PT): Jillyn Ledger, [place]   Encounter Date: 05/17/2020   PT End of Session - 05/17/20 1207    Visit Number 7    Number of Visits 13    Date for PT Re-Evaluation 05/25/20    Authorization Type BCBS - 30 VL for PT and OT, each counts as 1 visit    Authorization - Visit Number 7    Authorization - Number of Visits 30    PT Start Time [phone]   full time not used due to D/C   PT Stop Time 1009    PT Time Calculation (min) 33 min    Equipment Utilized During Treatment Gait belt    Activity Tolerance Patient tolerated treatment well    Behavior During Therapy [facility] for tasks assessed/performed           Past Medical History:  Diagnosis Date   Alcohol abuse    Anxiety    Atypical nevus 02/06/2015   Right Back - Mild   Atypical nevus 04/30/2016   Right Chest - Mild   Atypical nevus 04/30/2016   Right Shoulder - Mild   Chronic back pain    Drug abuse ([place])    Hypertension    Lumbar disc disease    Psoriasis     Past Surgical History:  Procedure Laterality Date   APPENDECTOMY     SHOULDER SURGERY Left     There were no vitals filed for this visit.   Subjective Assessment - 05/17/20 0938    Subjective Has been moving a lot of furniture out of his office.    Pertinent History PMHx includes afib, HTN, anxiety, hx drug and alcohol abuse.    Limitations Standing;Walking    Patient Stated Goals improve balance    Currently in Pain? Yes    Pain Score 5     Pain Location Knee    Pain Orientation Right;Left    Pain Descriptors / Indicators Aching;Sore    Pain Type Chronic pain    Pain Onset More than a month ago    Pain Score 7    Pain Location Foot    Pain  Orientation Right    Pain Descriptors / Indicators Sore    Pain Type Chronic pain    Pain Onset More than a month ago    Aggravating Factors  using it.              [facility] PT Assessment - 05/17/20 0943      6 Minute Walk- Baseline   6 Minute Walk- Baseline yes    BP (mmHg) 136/85    HR (bpm) 77    Modified Borg Scale for Dyspnea 0- Nothing at all      6 Minute walk- Post Test   6 Minute Walk Post Test yes    BP (mmHg) 139/87    HR (bpm) 72    Modified Borg Scale for Dyspnea 0- Nothing at all    Perceived Rate of Exertion (Borg) 11- Fairly light      6 minute walk test results    Endurance additional comments 1,192'                 Access Code: [email or web address] URL: [email or web address] Date: 05/17/2020 Prepared by: Westly Pam  Phone: [phone]   Fax:  [phone]  Name: Juan [name] MRN: [id number] Date of Birth: [birth year]  [facility] [address] [place], [place], [address] Phone: [phone]   Fax:  [phone]  Physical Therapy Treatment/Discharge Summary  Patient Details  Name: Juan [name] MRN: [id number] Date of Birth: 1965/08/22 Referring Provider (PT): Jillyn Ledger, [place]   Encounter Date: 05/17/2020   PT End of Session - 05/17/20 1207    Visit Number 7    Number of Visits 13    Date for PT Re-Evaluation 05/25/20    Authorization Type BCBS - 30 VL for PT and OT, each counts as 1 visit    Authorization - Visit Number 7    Authorization - Number of Visits 30    PT Start Time [phone]   full time not used due to D/C   PT Stop Time 1009    PT Time Calculation (min) 33 min    Equipment Utilized During Treatment Gait belt    Activity Tolerance Patient tolerated treatment well    Behavior During Therapy [facility] for tasks assessed/performed           Past Medical History:  Diagnosis Date   Alcohol abuse    Anxiety    Atypical nevus 02/06/2015   Right Back - Mild   Atypical nevus 04/30/2016   Right Chest - Mild   Atypical nevus 04/30/2016   Right Shoulder - Mild   Chronic back pain    Drug abuse ([place])    Hypertension    Lumbar disc disease    Psoriasis     Past Surgical History:  Procedure Laterality Date   APPENDECTOMY     SHOULDER SURGERY Left     There were no vitals filed for this visit.   Subjective Assessment - 05/17/20 0938    Subjective Has been moving a lot of furniture out of his office.    Pertinent History PMHx includes afib, HTN, anxiety, hx drug and alcohol abuse.    Limitations Standing;Walking    Patient Stated Goals improve balance    Currently in Pain? Yes    Pain Score 5     Pain Location Knee    Pain Orientation Right;Left    Pain Descriptors / Indicators Aching;Sore    Pain Type Chronic pain    Pain Onset More than a month ago    Pain Score 7    Pain Location Foot    Pain  Orientation Right    Pain Descriptors / Indicators Sore    Pain Type Chronic pain    Pain Onset More than a month ago    Aggravating Factors  using it.              [facility] PT Assessment - 05/17/20 0943      6 Minute Walk- Baseline   6 Minute Walk- Baseline yes    BP (mmHg) 136/85    HR (bpm) 77    Modified Borg Scale for Dyspnea 0- Nothing at all      6 Minute walk- Post Test   6 Minute Walk Post Test yes    BP (mmHg) 139/87    HR (bpm) 72    Modified Borg Scale for Dyspnea 0- Nothing at all    Perceived Rate of Exertion (Borg) 11- Fairly light      6 minute walk test results    Endurance additional comments 1,192'                 Access Code: [email or web address] URL: [email or web address] Date: 05/17/2020 Prepared by: Westly Pam  Phone: [phone]   Fax:  [phone]  Name: Juan [name] MRN: [id number] Date of Birth: [birth year]
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
## 2020-05-22 ENCOUNTER — Encounter: Payer: Self-pay | Admitting: Occupational Therapy

## 2020-05-22 ENCOUNTER — Other Ambulatory Visit: Payer: Self-pay

## 2020-05-22 ENCOUNTER — Ambulatory Visit: Payer: 59 | Attending: Internal Medicine | Admitting: Occupational Therapy

## 2020-05-22 DIAGNOSIS — R41842 Visuospatial deficit: Secondary | ICD-10-CM | POA: Diagnosis present

## 2020-05-22 DIAGNOSIS — R41840 Attention and concentration deficit: Secondary | ICD-10-CM | POA: Diagnosis present

## 2020-05-22 DIAGNOSIS — R41844 Frontal lobe and executive function deficit: Secondary | ICD-10-CM | POA: Diagnosis present

## 2020-05-22 DIAGNOSIS — M6281 Muscle weakness (generalized): Secondary | ICD-10-CM

## 2020-05-22 DIAGNOSIS — R2689 Other abnormalities of gait and mobility: Secondary | ICD-10-CM | POA: Diagnosis present

## 2020-05-22 DIAGNOSIS — R2681 Unsteadiness on feet: Secondary | ICD-10-CM

## 2020-05-22 DIAGNOSIS — R41841 Cognitive communication deficit: Secondary | ICD-10-CM | POA: Diagnosis present

## 2020-05-22 DIAGNOSIS — R278 Other lack of coordination: Secondary | ICD-10-CM | POA: Insufficient documentation

## 2020-05-22 NOTE — Therapy (Signed)
[facility] [address] [place], [place], [address] Phone: [phone]   Fax:  [phone]  Occupational Therapy Treatment  Patient Details  Name: DONTERIUS FILLEY MRN: [id number] Date of Birth: [birth year] Referring Provider (OT): Leary Roca PA-C   Encounter Date: 05/22/2020   OT End of Session - 05/22/20 1151    Visit Number 7    Number of Visits 25    Date for OT Re-Evaluation 06/15/20    Authorization Type BCBS    Authorization Time Period VL 30 sessions    OT Start Time 1151    OT Stop Time 1230    OT Time Calculation (min) 39 min    Activity Tolerance Patient tolerated treatment well    Behavior During Therapy [facility] for tasks assessed/performed           Past Medical History:  Diagnosis Date  . Alcohol abuse   . Anxiety   . Atypical nevus 02/06/2015   Right Back - Mild  . Atypical nevus 04/30/2016   Right Chest - Mild  . Atypical nevus 04/30/2016   Right Shoulder - Mild  . Chronic back pain   . Drug abuse (HCC)   . Hypertension   . Lumbar disc disease   . Psoriasis     Past Surgical History:  Procedure Laterality Date  . APPENDECTOMY    . SHOULDER SURGERY Left     There were no vitals filed for this visit.   Subjective Assessment - 05/22/20 1151    Subjective  Pt denies any changes. Pt says he's sore from moving stuff.    Pertinent History PMHx includes afib, HTN, anxiety, hx drug and alcohol abuse.    Patient Stated Goals to see how i am doing    Currently in Pain? Yes    Pain Score 5     Pain Location Generalized    Pain Descriptors / Indicators Sore    Pain Type Acute pain    Pain Onset More than a month ago    Pain Frequency Intermittent                        OT Treatments/Exercises (OP) - 05/22/20 1156      Cognitive Exercises   Other Cognitive Exercises 1 read a map on Constant Therapy with 80% accuracy    Other Cognitive Exercises 2 following and repeating   pattern for attention to details and visual memory. 100% accuracy      Visual/Perceptual Exercises   Other Exercises IQFit for spatial awareness and problem solving with increased time and min A required for following pattern and solving pattern. Pt with no reports of diplopia and reported improvement with diplopia.                    OT Short Term Goals - 05/10/20 1033      OT SHORT TERM GOAL #1   Title Pt will be independent with HEP 04/20/20    Time 4    Period Weeks    Status Achieved    Target Date 04/20/20      OT SHORT TERM GOAL #2   Title Pt will complete simple warm meal prep or home management tasks with supervision for increase in independence with ADLs and IADLs    Time 4    Period Weeks    Status Achieved      OT SHORT TERM GOAL #3   Title Pt will  perform environmental scanning in mod distracting environment with 80% accuracy    Time 4    Period Weeks    Status Achieved   93%     OT SHORT TERM GOAL #4   Title Pt will demonstrate improved functional LUE use by increasing Box and Blocks score by 7 blocks.    Baseline LUE 34    Time 4    Period Weeks    Status Achieved   42     OT SHORT TERM GOAL #5   Title Pt will verblize understanding of return to driving recommendations for safety.    Time 4    Period Weeks    Status Achieved      OT SHORT TERM GOAL #6   Title Pt will demonstrate improved grip strength by increasing LUE grip strength to 54 lbs or greater.    Baseline LUE 49.3 lbs    Time 4    Period Weeks    Status Achieved   77.6            OT Long Term Goals - 05/17/20 1027      OT LONG TERM GOAL #1   Title Pt will be independent with updated HEP 06/15/2020    Time 12    Period Weeks    Status On-going      OT LONG TERM GOAL #2   Title Pt will complete cooking task with following recipe or instructions with mod I for increase in independent with IADLs    Time 12    Period Weeks    Status Achieved      OT LONG TERM GOAL #3    Title Pt will perform physical and cognitive task simultaneously in mod distracting environment with 90% accuracy    Time 12    Period Weeks    Status Achieved   increased time and approximately 90% accuracy     OT LONG TERM GOAL #4   Title Pt will demonstrate improved grip strength by increasing LUE grip strength to 62 lbs or greater    Baseline LUE 49.3    Time 12    Period Weeks    Status Achieved   77.6     OT LONG TERM GOAL #5   Title Pt will demonstrate improved functional LUE use by increasing Box and Blocks score to 48 blocks or greater    Baseline LUE 34    Time 12    Period Weeks    Status Achieved   55     OT LONG TERM GOAL #6   Title Pt will perform work simulated tasks with 90% accuracy to prepare for readiness for return to work.    Time 12    Period Weeks    Status On-going   pt unclear about back to work plans at this time                Plan - 05/22/20 1209    Clinical Impression Statement Pt continues to progress.    OT Occupational Profile and History Detailed Assessment- Review of Records and additional review of physical, cognitive, psychosocial history related to current functional performance    Occupational performance deficits (Please refer to evaluation for details): IADL's;ADL's;Play;Work    Games developer / Function / Physical Skills FMC;Coordination;Flexibility;Mobility;Sensation;Vision;Endurance;IADL;ROM;UE functional use;GMC;Balance;ADL;Gait;Strength;Dexterity    Cognitive Skills Attention;Problem Solve;Safety Awareness;Sequencing;Understand;Memory;Perception;Thought;Learn    Rehab Potential Good    Clinical Decision Making Limited treatment options, no task modification necessary    Comorbidities Affecting  Occupational Performance: None    Modification or Assistance to Complete Evaluation  No modification of tasks or assist necessary to complete eval    OT Frequency 2x / week    OT Duration 12 weeks    OT Treatment/Interventions  Patient/family education;Neuromuscular education;Functional Mobility Training;Visual/perceptual remediation/compensation;Cognitive remediation/compensation;Therapeutic exercise;Therapeutic activities;Balance training;DME and/or AE instruction;Self-care/ADL training;Moist Heat    Plan plan to discharge next session.    Recommended Other Services seeing PT and ST at this location    Consulted and Agree with Plan of Care Patient           Patient will benefit from skilled therapeutic intervention in order to improve the following deficits and impairments:   Body Structure / Function / Physical Skills: FMC,Coordination,Flexibility,Mobility,Sensation,Vision,Endurance,IADL,ROM,UE functional use,GMC,Balance,ADL,Gait,Strength,Dexterity Cognitive Skills: Attention,Problem Solve,Safety Awareness,Sequencing,Understand,Memory,Perception,Thought,Learn     Visit Diagnosis: Other lack of coordination  Other abnormalities of gait and mobility  Attention and concentration deficit  Unsteadiness on feet  Frontal lobe and executive function deficit  Muscle weakness (generalized)  Visuospatial deficit    Problem List Patient Active Problem List   Diagnosis Date Noted  . Intracranial hemorrhage (HCC)   . Multiple closed facial bone fractures (HCC)   . Trauma   . Multiple trauma   . Traumatic brain injury with loss of consciousness (HCC)   . Essential hypertension   . DDD (degenerative disc disease), lumbosacral   . Polysubstance abuse (HCC)   . SAH (subarachnoid hemorrhage) (HCC) 02/13/2020  . OSA (obstructive sleep apnea) 01/31/2020    Junious Dresser MOT, OTR/L  05/22/2020, 12:32 PM  [facility] [address] [place], [place], [address] Phone: [phone]   Fax:  [phone]  Name: GERMANY CHELF MRN: [id number] Date of Birth: 10-08-1965

## 2020-05-23 ENCOUNTER — Ambulatory Visit: Payer: 59

## 2020-05-24 ENCOUNTER — Telehealth: Payer: Self-pay | Admitting: *Deleted

## 2020-05-24 ENCOUNTER — Ambulatory Visit: Payer: 59 | Admitting: Occupational Therapy

## 2020-05-24 NOTE — Telephone Encounter (Signed)
Prior authorization done via cover my meds for clobetasol foam. Waiting on the determination.

## 2020-05-25 ENCOUNTER — Telehealth: Payer: Self-pay | Admitting: *Deleted

## 2020-05-25 ENCOUNTER — Ambulatory Visit: Payer: 59

## 2020-05-25 NOTE — Telephone Encounter (Signed)
Prior Authorization done via cover my meds for patients [employer]. Waiting on determination.

## 2020-05-28 ENCOUNTER — Ambulatory Visit: Payer: 59

## 2020-05-29 ENCOUNTER — Telehealth (HOSPITAL_COMMUNITY): Payer: Self-pay | Admitting: Licensed Clinical Social Worker

## 2020-05-29 ENCOUNTER — Ambulatory Visit: Payer: 59 | Admitting: Occupational Therapy

## 2020-05-29 ENCOUNTER — Other Ambulatory Visit: Payer: Self-pay

## 2020-05-29 DIAGNOSIS — R2689 Other abnormalities of gait and mobility: Secondary | ICD-10-CM

## 2020-05-29 DIAGNOSIS — R41842 Visuospatial deficit: Secondary | ICD-10-CM

## 2020-05-29 DIAGNOSIS — R41840 Attention and concentration deficit: Secondary | ICD-10-CM

## 2020-05-29 DIAGNOSIS — R2681 Unsteadiness on feet: Secondary | ICD-10-CM

## 2020-05-29 DIAGNOSIS — R278 Other lack of coordination: Secondary | ICD-10-CM

## 2020-05-29 DIAGNOSIS — M6281 Muscle weakness (generalized): Secondary | ICD-10-CM

## 2020-05-29 DIAGNOSIS — R41844 Frontal lobe and executive function deficit: Secondary | ICD-10-CM

## 2020-05-29 NOTE — Therapy (Signed)
[facility] [address] [place], [place], [address] Phone: [phone]   Fax:  [phone]  Occupational Therapy Treatment & Discharge  Patient Details  Name: Juan [name] MRN: [id number] Date of Birth: 1965-10-29 Referring Provider (OT): Alferd Apa PA-C   Encounter Date: 05/29/2020   OT End of Session - 05/29/20 1150    Visit Number 8    Number of Visits 25    Date for OT Re-Evaluation 06/15/20    Authorization Type BCBS    Authorization Time Period VL 30 sessions    OT Start Time 1150    OT Stop Time 1215   short session - discharge session   OT Time Calculation (min) 25 min    Activity Tolerance Patient tolerated treatment well    Behavior During Therapy [facility] for tasks assessed/performed           Past Medical History:  Diagnosis Date  . Alcohol abuse   . Anxiety   . Atypical nevus 02/06/2015   Right Back - Mild  . Atypical nevus 04/30/2016   Right Chest - Mild  . Atypical nevus 04/30/2016   Right Shoulder - Mild  . Chronic back pain   . Drug abuse ([place])   . Hypertension   . Lumbar disc disease   . Psoriasis     Past Surgical History:  Procedure Laterality Date  . APPENDECTOMY    . SHOULDER SURGERY Left     There were no vitals filed for this visit.   Subjective Assessment - 05/29/20 1208    Subjective  Pt reports his right knee and left knee still are painful at times.    Pertinent History PMHx includes afib, HTN, anxiety, hx drug and alcohol abuse.    Patient Stated Goals to see how i am doing    Currently in Pain? --   pt has pain in knees.               OCCUPATIONAL THERAPY DISCHARGE SUMMARY  Visits from Start of Care: 8  Current functional level related to goals / functional outcomes: Pt has met all STGs and has met 5/6 LTGs. Deferred work related goal d/t unclear what back to work would be at this time. Pt has made progress with overall cognition and dual tasking,  environmental scanning and RUE functional use.    Remaining deficits: Some cognitive deficits remain. Some diplopia continues to be present  Education / Equipment: HEPs for diplopia and coordination Plan: Patient agrees to discharge.  Patient goals were met. Patient is being discharged due to meeting the stated rehab goals.  ?????                        OT Short Term Goals - 05/10/20 1033      OT SHORT TERM GOAL #1   Title Pt will be independent with HEP 04/20/20    Time 4    Period Weeks    Status Achieved    Target Date 04/20/20      OT SHORT TERM GOAL #2   Title Pt will complete simple warm meal prep or home management tasks with supervision for increase in independence with ADLs and IADLs    Time 4    Period Weeks    Status Achieved      OT SHORT TERM GOAL #3   Title Pt will perform environmental scanning in mod distracting environment with 80% accuracy    Time  4    Period Weeks    Status Achieved   93%     OT SHORT TERM GOAL #4   Title Pt will demonstrate improved functional LUE use by increasing Box and Blocks score by 7 blocks.    Baseline LUE 34    Time 4    Period Weeks    Status Achieved   42     OT SHORT TERM GOAL #5   Title Pt will verblize understanding of return to driving recommendations for safety.    Time 4    Period Weeks    Status Achieved      OT SHORT TERM GOAL #6   Title Pt will demonstrate improved grip strength by increasing LUE grip strength to 54 lbs or greater.    Baseline LUE 49.3 lbs    Time 4    Period Weeks    Status Achieved   77.6            OT Long Term Goals - 05/29/20 1150      OT LONG TERM GOAL #1   Title Pt will be independent with updated HEP 06/15/2020    Time 12    Period Weeks    Status Achieved      OT LONG TERM GOAL #2   Title Pt will complete cooking task with following recipe or instructions with mod I for increase in independent with IADLs    Time 12    Period Weeks    Status Achieved       OT LONG TERM GOAL #3   Title Pt will perform physical and cognitive task simultaneously in mod distracting environment with 90% accuracy    Time 12    Period Weeks    Status Achieved   increased time and approximately 90% accuracy     OT LONG TERM GOAL #4   Title Pt will demonstrate improved grip strength by increasing LUE grip strength to 62 lbs or greater    Baseline LUE 49.3    Time 12    Period Weeks    Status Achieved   77.6     OT LONG TERM GOAL #5   Title Pt will demonstrate improved functional LUE use by increasing Box and Blocks score to 48 blocks or greater    Baseline LUE 34    Time 12    Period Weeks    Status Achieved   55     OT LONG TERM GOAL #6   Title Pt will perform work simulated tasks with 90% accuracy to prepare for readiness for return to work.    Time 12    Period Weeks    Status Deferred   pt unclear about back to work plans at this time                Plan - 05/29/20 1155    Clinical Impression Statement Pt is progressing and has met all STGs and 5/6 LTGs. Work simulated goal deferred secondary to unclear expectations of work at discharge. Pt is ready to discharge from occupational therapy at this time. Pt has made great improvements and progress towards goals. Pt has HEP for diplopia to continue. Pt has had improvements with diplopia but reports some deficits still.    OT Occupational Profile and History Detailed Assessment- Review of Records and additional review of physical, cognitive, psychosocial history related to current functional performance    Occupational performance deficits (Please refer to evaluation for details): IADL's;ADL's;Play;Work  Body Structure / Function / Physical Skills FMC;Coordination;Flexibility;Mobility;Sensation;Vision;Endurance;IADL;ROM;UE functional use;GMC;Balance;ADL;Gait;Strength;Dexterity    Cognitive Skills Attention;Problem Solve;Safety Awareness;Sequencing;Understand;Memory;Perception;Thought;Learn     Rehab Potential Good    Clinical Decision Making Limited treatment options, no task modification necessary    Comorbidities Affecting Occupational Performance: None    Modification or Assistance to Complete Evaluation  No modification of tasks or assist necessary to complete eval    OT Frequency 2x / week    OT Duration 12 weeks    OT Treatment/Interventions Patient/family education;Neuromuscular education;Functional Mobility Training;Visual/perceptual remediation/compensation;Cognitive remediation/compensation;Therapeutic exercise;Therapeutic activities;Balance training;DME and/or AE instruction;Self-care/ADL training;Moist Heat    Plan discharge    Recommended Other Services seeing PT and ST at this location    Consulted and Agree with Plan of Care Patient           Patient will benefit from skilled therapeutic intervention in order to improve the following deficits and impairments:   Body Structure / Function / Physical Skills: FMC,Coordination,Flexibility,Mobility,Sensation,Vision,Endurance,IADL,ROM,UE functional use,GMC,Balance,ADL,Gait,Strength,Dexterity Cognitive Skills: Attention,Problem Solve,Safety Awareness,Sequencing,Understand,Memory,Perception,Thought,Learn     Visit Diagnosis: Other lack of coordination  Other abnormalities of gait and mobility  Attention and concentration deficit  Unsteadiness on feet  Frontal lobe and executive function deficit  Muscle weakness (generalized)  Visuospatial deficit    Problem List Patient Active Problem List   Diagnosis Date Noted  . Intracranial hemorrhage ([place])   . Multiple closed facial bone fractures ([place])   . Trauma   . Multiple trauma   . Traumatic brain injury with loss of consciousness ([place])   . Essential hypertension   . DDD (degenerative disc disease), lumbosacral   . Polysubstance abuse ([place])   . SAH (subarachnoid hemorrhage) ([place]) 02/13/2020  . OSA (obstructive sleep apnea) 01/31/2020    Zachery Conch MOT, OTR/L  05/29/2020, 12:17 PM  [facility] [address] [place], [place], [address] Phone: [phone]   Fax:  [phone]  Name: Juan [name] MRN: [id number] Date of Birth: 03/10/1966

## 2020-05-29 NOTE — Telephone Encounter (Signed)
Client contacts office stating he was completing treatment at [facility] and family concerned about substance abuse and requesting client complete a program to address. Client states does not believe currently has an alcohol problem but acknowledges family concern. Client reports being on benzo and opiate for pain, anxiety, and sleep and is not interested in stopping those medications at this time. Client is in agreement to complete clinical assessment and discuss appropriate level of care options.

## 2020-05-30 ENCOUNTER — Encounter: Payer: Self-pay | Admitting: Speech Pathology

## 2020-05-30 ENCOUNTER — Ambulatory Visit: Payer: 59 | Admitting: Dermatology

## 2020-05-30 ENCOUNTER — Ambulatory Visit: Payer: 59 | Admitting: Speech Pathology

## 2020-05-30 DIAGNOSIS — R278 Other lack of coordination: Secondary | ICD-10-CM | POA: Diagnosis not present

## 2020-05-30 DIAGNOSIS — R41841 Cognitive communication deficit: Secondary | ICD-10-CM

## 2020-05-30 NOTE — Therapy (Signed)
[facility] [address] [place], [place], [address] Phone: [phone]   Fax:  [phone]  Speech Language Pathology Treatment & Discharge Summary  Patient Details  Name: Juan [name] MRN: [id number] Date of Birth: 06/14/1965 Referring Provider (SLP): Rodolph Bong, Barth Kirks, PA-C (referring)/Roberts, Jori Moll, MD (PCP)   Encounter Date: 05/30/2020   End of Session - 05/30/20 1524    Visit Number 12    Number of Visits 76    SLP Start Time 1601    SLP Stop Time  0932    SLP Time Calculation (min) 40 min           Past Medical History:  Diagnosis Date  . Alcohol abuse   . Anxiety   . Atypical nevus 02/06/2015   Right Back - Mild  . Atypical nevus 04/30/2016   Right Chest - Mild  . Atypical nevus 04/30/2016   Right Shoulder - Mild  . Chronic back pain   . Drug abuse ([place])   . Hypertension   . Lumbar disc disease   . Psoriasis     Past Surgical History:  Procedure Laterality Date  . APPENDECTOMY    . SHOULDER SURGERY Left     There were no vitals filed for this visit.   Subjective Assessment - 05/30/20 1447    Subjective "light is still bothering me, I need to wear glasses outside, I guess"    Currently in Pain? 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 He has identified several jobs he can do  safely and accomodate his attention and memory demonstrated adequate anticipatory awareness. He has carried over using to do lists, taking notes, and using med Environmental education officer.      Assessment / Recommendations / Plan   Plan Discharge SLP treatment due to (comment)      Progression Toward Goals   Progression toward goals Goals met, education completed, patient discharged from [facility]  Visits from Start of Care: 12  Current functional level related to goals / functional outcomes: See goals below   Remaining deficits: Mild lhigh level memory and attention impairment   Education / Equipment: Compensations for cognitive impairments, energy conservation Plan: Patient agrees to discharge.  Patient goals were met. Patient is being discharged due to meeting the stated rehab goals.  ?????         SLP Short Term Goals - 05/30/20 1509      SLP SHORT TERM GOAL #1   Title Pt will use external aids/alerts to take PM medications 5/7 days.    Baseline 03/28/20    Status Achieved      SLP SHORT TERM GOAL #2   Title Pt will establish system for organization/recall and bring to 3 therapy  sessions.    Baseline 03-26-20, 04-02-20    Status Achieved      SLP SHORT TERM GOAL #3   Title Pt will demo appropriate use of compensations for attention, processing to recall >80% of details from mod complex paragraph (written or verbal presentation), x 3 sessions.    Baseline 04-20-20    Time 1    Period Weeks    Status Partially Met            SLP Long Term Goals - 05/30/20 1509      SLP LONG TERM GOAL #1   Title Pt will report no missed medications over 2 consecutive visits with use of external aids/alerts.    Baseline 03/28/20; 04/30/20,    Status Achieved      SLP LONG TERM GOAL #2   Title Pt will demonstrate appropriate judgment, anticipatory awareness of tasks to avoid and accomodations necessary for workplace safety.    Baseline 04/30/20, 05-08-20,  05-10-20    Status Achieved      SLP LONG TERM GOAL #3   Title Pt will use organization/recall system to manage appointments, finances and to [facility] independently over 3 sessions.    Baseline 04-20-20; 04/30/20, 05-08-20    Status Achieved      SLP LONG TERM GOAL #4   Title Pt will demo divided attention between 2 personally relevant tasks for 10 minutes maintaining >85% accuracy.    Time 4    Period Weeks    Status Partially Met            Plan - 05/30/20 1522    Clinical Impression Statement Juan [name] presents with overall cont'd mild and resolving cognitive communication impairment secondary to TBI, with improving/improved deficits in higher level attention, anticipatory awareness, memory and executive function. He has met almost all his LTGs at this time and will be reduced to once/week. He reports dificulty with memory as persisting, but reports he is using memory compensations successfully. 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 Pt continues working on balance. At this time I recommend Juan [name] be d/c'd from [facility] as goals met and education complete. Pt in agreement    Speech Therapy Frequency 1x /week    Treatment/Interventions Environmental controls;Language facilitation;Cueing hierarchy;SLP instruction and feedback;Cognitive reorganization;Compensatory techniques;Functional tasks;Compensatory strategies;Internal/external aids;Patient/family education    Potential to Achieve Goals Good           Patient will benefit from skilled therapeutic intervention in order to improve the following deficits and impairments:   Cognitive communication deficit    Problem List Patient Active Problem List   Diagnosis Date Noted  . Intracranial hemorrhage ([place])   . Multiple closed facial bone fractures ([place])   . Trauma   . Multiple trauma   . Traumatic brain injury with loss of consciousness ([place])   . Essential hypertension   . DDD  (degenerative disc disease), lumbosacral   . Polysubstance abuse ([place])   . SAH (subarachnoid hemorrhage) ([place]) 02/13/2020  . OSA (obstructive sleep apnea) 01/31/2020    Lovvorn, Annye Rusk MS, CCC-SLP 05/30/2020, 3:25 PM  [facility] [address] [place], [place], [address] Phone: [phone]   Fax:  [phone]   Name: Juan [name] MRN: [id number] Date of Birth: [birth year]

## 2020-05-31 ENCOUNTER — Encounter: Payer: 59 | Admitting: Occupational Therapy

## 2020-05-31 NOTE — Telephone Encounter (Signed)
FAXED OFFICE NOTES TO [facility] [phone] FOR [employer] APPROVAL

## 2020-06-05 ENCOUNTER — Other Ambulatory Visit: Payer: Self-pay

## 2020-06-05 ENCOUNTER — Ambulatory Visit (INDEPENDENT_AMBULATORY_CARE_PROVIDER_SITE_OTHER): Payer: 59 | Admitting: Licensed Clinical Social Worker

## 2020-06-05 DIAGNOSIS — F1010 Alcohol abuse, uncomplicated: Secondary | ICD-10-CM
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
## 2020-06-07 ENCOUNTER — Telehealth: Payer: Self-pay
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
## 2020-06-07 NOTE — Telephone Encounter (Signed)
Fax received from [facility] needing office notes for the patient's [place].  Information faxed to [facility] @ [phone].

## 2020-06-12 ENCOUNTER — Telehealth (HOSPITAL_COMMUNITY): Payer: Self-pay | Admitting: Licensed Clinical Social Worker

## 2020-06-19 ENCOUNTER — Ambulatory Visit (INDEPENDENT_AMBULATORY_CARE_PROVIDER_SITE_OTHER): Payer: 59 | Admitting: Licensed Clinical Social Worker

## 2020-06-19 ENCOUNTER — Other Ambulatory Visit: Payer: Self-pay

## 2020-06-19 DIAGNOSIS — F1010 Alcohol abuse, uncomplicated: Secondary | ICD-10-CM | POA: Diagnosis not present
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
## 2020-06-20 ENCOUNTER — Telehealth: Payer: Self-pay

## 2020-06-20 NOTE — Telephone Encounter (Signed)
[name] last office visit notes faxed to [facility].

## 2020-06-20 NOTE — Telephone Encounter (Signed)
Fax received from [facility] requesting office notes from the patient's last visit due to patient having new insurance this year.

## 2020-06-25 ENCOUNTER — Telehealth (HOSPITAL_COMMUNITY): Payer: Self-pay | Admitting: Licensed Clinical Social Worker

## 2020-06-26 ENCOUNTER — Ambulatory Visit (INDEPENDENT_AMBULATORY_CARE_PROVIDER_SITE_OTHER): Payer: 59 | Admitting: Licensed Clinical Social Worker

## 2020-06-26 ENCOUNTER — Other Ambulatory Visit: Payer: Self-pay

## 2020-06-26 DIAGNOSIS — F1010 Alcohol abuse, uncomplicated: Secondary | ICD-10-CM | POA: Diagnosis not present

## 2020-06-26 NOTE — Progress Notes (Signed)
Virtual Visit via Video Note  I connected with Juan [name] on 06/26/20 at 11:00 AM EST by a video enabled telemedicine application and verified that I am speaking with the correct person using two identifiers.  Location: Patient: mother's house Provider: office   I discussed the limitations of evaluation and management by telemedicine and the availability of in person appointments. The patient expressed understanding and agreed to proceed.  I discussed the assessment and treatment plan with the patient. The patient was provided an opportunity to ask questions and all were answered. The patient agreed with the plan and demonstrated an understanding of the instructions.   The patient was advised to call back or seek an in-person evaluation if the symptoms worsen or if the condition fails to improve as anticipated.  I provided 45 minutes of non-face-to-face time during this encounter.   [name], LCSW    THERAPIST PROGRESS NOTE  Session Time: 11am-11:50am  Participation Level: Active  Behavioral Response: CasualAlertIrritable  Type of Therapy: Individual Therapy  Treatment Goals addressed: Coping and Diagnosis: alcohol abuse  Interventions: Motivational Interviewing  Summary: Juan [name] is a 55 y.o. male who presents with alcohol abuse.   Suicidal/Homicidal: Nowithout intent/plan  Therapist Response: Clinician met with client via telehealth. Clinician inquired about SI/HI/psychosis and overall level of functioning including substance use. Clinician reviewed discussion with doctor from previous week and reminded client of the high risk of drinking on medications. Client reported he has been sober one week and is now staying with his family, this would show progress toward goal of decreasing alcohol use. Per discussion with wife previous day client showed up intoxicated at her house, client denied going to the home or drinking. Client stated there was no alcohol in  the home where he is staying with his parents. Client reported feeling frustration with authority figures and feeling people are telling him what to do, where to go, or lecturing about drinking. Clinician utilized OARs to explore client motivation for change and receptiveness to feedback. Client became agitated when asked about how his drinking effected family, health, and work. Client agreed to try mindful breathing at least 1 time daily for 2 mins between now and next session to work on managing frustration and agitation with feeling he is being told what to do.  Plan: Return again in 1-2 weeks.  Diagnosis: Axis I: Alcohol Abuse        [name], LCSW 06/26/2020

## 2020-07-02 NOTE — Telephone Encounter (Signed)
Fax received from [employer] stating the patient's prior authorization for [name] is approved.

## 2020-07-03 ENCOUNTER — Other Ambulatory Visit: Payer: Self-pay

## 2020-07-03 ENCOUNTER — Ambulatory Visit (INDEPENDENT_AMBULATORY_CARE_PROVIDER_SITE_OTHER): Payer: 59 | Admitting: Licensed Clinical Social Worker

## 2020-07-03 DIAGNOSIS — F1010 Alcohol abuse, uncomplicated: Secondary | ICD-10-CM

## 2020-07-03 NOTE — Progress Notes (Signed)
Virtual Visit via Video Note  I connected with Juan [name] on 07/03/20 at 11:00 AM EST by a video enabled telemedicine application and verified that I am speaking with the correct person using two identifiers.  Location: Patient: parents home where currently living Provider: office   I discussed the limitations of evaluation and management by telemedicine and the availability of in person appointments. The patient expressed understanding and agreed to proceed.  I discussed the assessment and treatment plan with the patient. The patient was provided an opportunity to ask questions and all were answered. The patient agreed with the plan and demonstrated an understanding of the instructions.   The patient was advised to call back or seek an in-person evaluation if the symptoms worsen or if the condition fails to improve as anticipated.  I provided 45 minutes of non-face-to-face time during this encounter.   [name], LCSW    THERAPIST PROGRESS NOTE  Session Time: 11:10am-11:55am  Participation Level: Active  Behavioral Response: CasualAlertAnxious and Dysphoric  Type of Therapy: Individual Therapy  Treatment Goals addressed: Coping and Diagnosis: decrease alcohol abuse, increase coping skills to at least 1 time daily to manage mood in place of alcohol use  Interventions: Motivational Interviewing and Supportive  Summary: Juan [name] is a 55 y.o. male who presents with alcohol use disorder severe and symptoms of depression and anxiety. Client reported attempting 5-7-9 breathing daily to help with anxiety but continues to feel anxious first think in the morning and before bed. Client reported good lunch recently with his son and admitted to lying about visiting wife's home while intoxicated. Client acknowledged breaking boundaries about drinking in multiple living environments and acknowledged the only reason he did not drink immediately after the accident and TBI was  lack of access. Client was receptive to the recommendation of residential treatment more than previously and stated had looked into [facility], though utilized finances as a continued excuse despite family members willing to help. Client will contact [facility] and a visit was scheduled for 1 week if no placement has been accepted.  Suicidal/Homicidal: Nowithout intent/plan. Client endorsed feeling depressed but denied any SI or passive thoughts of death.  Therapist Response: Clinician checked in with client, assessing for SI/HI/psychosis and overall level of functioning including alcohol use. Clinician utilized OARs from [place] to assess clients stage of change. Clinician challenged client report of amount and frequency of alcohol use. Clinician provided examples of substance use despite living situation and consequences and reviewed criteria and level of care recommendation.  Client encouraged to contact [facility] billing department with any questions about insurance coverage for any medical or mental health services since changing insurance companies.  Plan: Return again in 1 weeks.  Diagnosis: Axis I: Alcohol Abuse      [name], LCSW 07/03/2020

## 2020-07-09 ENCOUNTER — Telehealth: Payer: Self-pay | Admitting: Dermatology

## 2020-07-09 DIAGNOSIS — L400 Psoriasis vulgaris: Secondary | ICD-10-CM

## 2020-07-09 MED ORDER — OTEZLA 30 MG PO TABS: 30.0000 mg | ORAL_TABLET | Freq: Two times a day (BID) | ORAL | 2 refills | Status: DC

## 2020-07-09 NOTE — Telephone Encounter (Signed)
Says we have to call [facility] about Rx; was told we have to call them about refill for [facility]. He said he spoke to them on Friday at  phone [phone]. He's been out since beginning of January; also asked if we have sample. Leaving town for a month a week from Tuesday.

## 2020-07-09 NOTE — Telephone Encounter (Signed)
PA is on file all we has to do was change the pharmacy to [facility] in [place]

## 2020-07-10 ENCOUNTER — Other Ambulatory Visit: Payer: Self-pay

## 2020-07-10 ENCOUNTER — Ambulatory Visit (INDEPENDENT_AMBULATORY_CARE_PROVIDER_SITE_OTHER): Payer: 59 | Admitting: Licensed Clinical Social Worker

## 2020-07-10 DIAGNOSIS — F1010 Alcohol abuse, uncomplicated: Secondary | ICD-10-CM

## 2020-07-10 NOTE — Progress Notes (Signed)
Virtual Visit via Video Note  I connected with Juan [name] on 07/10/20 at 11:00 AM EST by a video enabled telemedicine application and verified that I am speaking with the correct person using two identifiers.  Location: Patient: residence Provider: office   I discussed the limitations of evaluation and management by telemedicine and the availability of in person appointments. The patient expressed understanding and agreed to proceed.  I discussed the assessment and treatment plan with the patient. The patient was provided an opportunity to ask questions and all were answered. The patient agreed with the plan and demonstrated an understanding of the instructions.   The patient was advised to call back or seek an in-person evaluation if the symptoms worsen or if the condition fails to improve as anticipated.  I provided 45 minutes of non-face-to-face time during this encounter.    A , LCSW    THERAPIST PROGRESS NOTE  Session Time: 11:05am-11:50am  Participation Level: Active  Behavioral Response: CasualAlertAnxious  Type of Therapy: Individual Therapy  Treatment Goals addressed: Coping and Diagnosis: alcohol abuse  Interventions: Motivational Interviewing and Supportive  Summary: Juan [name] is a 54 y.o. male who presents with alcohol abuse. Client reports increased depression and anxiety and not liking being told what to do while staying at his parents home. Client reports multiple tasks outstanding for work are why he was not able to enter treatment sooner. Client reports on average taking his daily alprazolam dose, between one half and 2 hydrocodone, and 2 drinks. Client reported only drinking 2 drinks 2 days weekly but acknowledged that he would more frequently if out with friends. Client noted trouble sleeping. Client spoke with Aaron last week from [facility] and states he was told to call back this week for more information and to reserve a bed starting  the following Tuesday. Client did not feel medically monitored detox was needed at this time however was receptive to contacting Dr. Roberts prior to his appointment on Monday for recommendations on stopping all substances. Client voiced concerns about pain management while in treatment and not wanting to engage in spiritual activities. Client voiced increased agitation with not living at home and the dynamic with his wife however acknowledged there is nothing he can do about the situation today except stay sober.  Suicidal/Homicidal: Nowithout intent/plan  Therapist Response: Clinician met with client via telehealth. Clinician assessed for SI/HI/psychosis and overall level of functioning, including any substance use. Clinician challenged client reasons for not engaging in residential treatment yet. clinician praised client for gathering information and planned start date pending bed availability. Clinician processed with client barriers to treatment and problem solved in session several barriers related to work. Clinician explained the process of detox and recommended [facility] due to high risk coming off a combination of alcohol, benzos, and opioids. Client stated he was not concerns and would be able to wean himself off before tx, which stated he must be 4 days sober prior to treatment.  Plan: Return again in 1 week pending not engaged in residential facility [facility] as planned by client.  Diagnosis: Axis I: Alcohol Abuse        A , LCSW 07/10/2020  

## 2020-07-12 ENCOUNTER — Telehealth: Payer: Self-pay | Admitting: Physician Assistant

## 2020-07-12 DIAGNOSIS — L400 Psoriasis vulgaris: Secondary | ICD-10-CM

## 2020-07-12 MED ORDER — OTEZLA 30 MG PO TABS: 30.0000 mg | ORAL_TABLET | Freq: Two times a day (BID) | ORAL | 2 refills | Status: DC

## 2020-07-12 NOTE — Telephone Encounter (Signed)
Phone call from [facility] stating they need the patient's prescription for [facility] sent to them because the other Pharmacy patient was getting the prescription from can't transfer the prescription to them.  I asked Ollen Gross what was their address so I could send the prescription electronically to them.  Ollen Gross gave me the address and the prescription was sent.

## 2020-07-12 NOTE — Telephone Encounter (Signed)
Got call from pharmacy last night and he was approved for 30 mg refill but was also told that our office also prescribed a starter kit and that needs approval. Pharmacy # [phone] . "[facility]". HE IS LEAVING TOWN Tuesday  AND WON'T BE BACK FOR A MONTH AND IS OUT OF MED

## 2020-07-17 ENCOUNTER — Other Ambulatory Visit: Payer: Self-pay

## 2020-07-17 NOTE — Telephone Encounter (Signed)
Per patient  D/c starter dose and just maintenance  Pharmacy aware no PA needed.

## 2020-08-27 ENCOUNTER — Telehealth: Payer: Self-pay | Admitting: Cardiology

## 2020-08-27 NOTE — Telephone Encounter (Signed)
4.11.22 LVM on cell phone to schedule fu appt w/Dr Sharlyne Cai

## 2020-11-27 ENCOUNTER — Other Ambulatory Visit: Payer: Self-pay | Admitting: Dermatology

## 2020-11-27 DIAGNOSIS — L400 Psoriasis vulgaris: Secondary | ICD-10-CM

## 2020-12-31 ENCOUNTER — Telehealth: Payer: Self-pay

## 2020-12-31 NOTE — Telephone Encounter (Signed)
Prior authorization done through cover my meds for the patient's Otezla.     This request has been approved.  Please note any additional information provided by [facility] at the bottom of your screen.

## 2020-12-31 NOTE — Telephone Encounter (Signed)
Fax received from cover my meds needing a prior authorization for his [name].

## 2021-03-19 ENCOUNTER — Other Ambulatory Visit: Payer: Self-pay | Admitting: Physician Assistant

## 2021-03-19 DIAGNOSIS — L400 Psoriasis vulgaris: Secondary | ICD-10-CM

## 2021-03-21 ENCOUNTER — Other Ambulatory Visit: Payer: Self-pay | Admitting: Dermatology

## 2021-04-25 ENCOUNTER — Other Ambulatory Visit: Payer: Self-pay | Admitting: Dermatology

## 2021-05-14 ENCOUNTER — Other Ambulatory Visit: Payer: Self-pay | Admitting: Dermatology

## 2021-05-16 ENCOUNTER — Other Ambulatory Visit: Payer: Self-pay | Admitting: Dermatology

## 2021-08-01 ENCOUNTER — Ambulatory Visit: Payer: 59 | Admitting: Physician Assistant

## 2021-08-01 ENCOUNTER — Other Ambulatory Visit: Payer: Self-pay

## 2021-08-01 ENCOUNTER — Encounter: Payer: Self-pay | Admitting: Physician Assistant

## 2021-08-01 DIAGNOSIS — L400 Psoriasis vulgaris: Secondary | ICD-10-CM | POA: Diagnosis not present

## 2021-08-01 DIAGNOSIS — Z79899 Other long term (current) drug therapy: Secondary | ICD-10-CM

## 2021-08-01 DIAGNOSIS — Z1283 Encounter for screening for malignant neoplasm of skin: Secondary | ICD-10-CM | POA: Diagnosis not present

## 2021-08-01 MED ORDER — CLOBETASOL PROPIONATE 0.05 % EX FOAM: CUTANEOUS | 3 refills | Status: DC

## 2021-08-01 MED ORDER — CLOBETASOL PROPIONATE 0.05 % EX CREA: 1.0000 "application " | TOPICAL_CREAM | Freq: Two times a day (BID) | CUTANEOUS | 6 refills | Status: AC

## 2021-08-05 ENCOUNTER — Telehealth: Payer: Self-pay

## 2021-08-05 NOTE — Telephone Encounter (Signed)
Fax received from [facility] for prior authorization for the patient's Clobetasol Foam.  ?

## 2021-08-05 NOTE — Telephone Encounter (Signed)
Prior authorization for the patient's Clobetasol Foam done through Cover MyMeds. ? ? ? ? ?Your information has been sent to [employer]. ?

## 2021-08-06 ENCOUNTER — Telehealth: Payer: Self-pay | Admitting: Physician Assistant

## 2021-08-06 LAB — COMPREHENSIVE METABOLIC PANEL
AG Ratio: 1.6 (calc) (ref 1.0–2.5)
ALT: 22 U/L (ref 9–46)
AST: 28 U/L (ref 10–35)
Albumin: 4.6 g/dL (ref 3.6–5.1)
Alkaline phosphatase (APISO): 109 U/L (ref 35–144)
BUN/Creatinine Ratio: 8 (calc) (ref 6–22)
BUN: 6 mg/dL — ABNORMAL LOW (ref 7–25)
CO2: 26 mmol/L (ref 20–32)
Calcium: 9.7 mg/dL (ref 8.6–10.3)
Chloride: 99 mmol/L (ref 98–110)
Creat: 0.78 mg/dL (ref 0.70–1.30)
Globulin: 2.9 g/dL (calc) (ref 1.9–3.7)
Glucose, Bld: 111 mg/dL — ABNORMAL HIGH (ref 65–99)
Potassium: 3.1 mmol/L — ABNORMAL LOW (ref 3.5–5.3)
Sodium: 140 mmol/L (ref 135–146)
Total Bilirubin: 1 mg/dL (ref 0.2–1.2)
Total Protein: 7.5 g/dL (ref 6.1–8.1)

## 2021-08-06 LAB — CBC WITH DIFFERENTIAL/PLATELET
Absolute Monocytes: 568 cells/uL (ref 200–950)
Basophils Absolute: 58 cells/uL (ref 0–200)
Basophils Relative: 1 %
Eosinophils Absolute: 278 cells/uL (ref 15–500)
Eosinophils Relative: 4.8 %
HCT: 48.5 % (ref 38.5–50.0)
Hemoglobin: 17 g/dL (ref 13.2–17.1)
Lymphs Abs: 1943 cells/uL (ref 850–3900)
MCH: 32.7 pg (ref 27.0–33.0)
MCHC: 35.1 g/dL (ref 32.0–36.0)
MCV: 93.3 fL (ref 80.0–100.0)
MPV: 11.6 fL (ref 7.5–12.5)
Monocytes Relative: 9.8 %
Neutro Abs: 2952 cells/uL (ref 1500–7800)
Neutrophils Relative %: 50.9 %
Platelets: 174 10*3/uL (ref 140–400)
RBC: 5.2 10*6/uL (ref 4.20–5.80)
RDW: 11.8 % (ref 11.0–15.0)
Total Lymphocyte: 33.5 %
WBC: 5.8 10*3/uL (ref 3.8–10.8)

## 2021-08-06 LAB — HEPATITIS C ANTIBODY
Hepatitis C Ab: NONREACTIVE
SIGNAL TO CUT-OFF: 0.04 (ref <1.00)

## 2021-08-06 LAB — QUANTIFERON-TB GOLD PLUS
Mitogen-NIL: >10 IU/mL
NIL: 0.05 IU/mL
QuantiFERON-TB Gold Plus: NEGATIVE
TB1-NIL: 0.01 IU/mL
TB2-NIL: 0 IU/mL

## 2021-08-06 LAB — ANTI-NUCLEAR AB-TITER (ANA TITER): ANA Titer 1: 1:40 {titer} — ABNORMAL HIGH

## 2021-08-06 LAB — ANA: Anti Nuclear Antibody (ANA): POSITIVE — AB

## 2021-08-06 LAB — HEPATITIS B CORE ANTIBODY, TOTAL: Hep B Core Total Ab: NONREACTIVE

## 2021-08-06 LAB — HEPATITIS B SURFACE ANTIGEN: Hepatitis B Surface Ag: NONREACTIVE

## 2021-08-06 LAB — HEPATITIS B SURFACE ANTIBODY, QUANTITATIVE: Hep B S AB Quant (Post): <5 m[IU]/mL — ABNORMAL LOW (ref >=10)

## 2021-08-06 NOTE — Telephone Encounter (Signed)
Patient calling to inform us that he got a message that his blood work results from last week are in. He states that we were going to start him on a new biologic. I informed patient that I would inform the provider and clinical staff so we can start on the next step. He also asked about PA on his clobetasol and I informed him that clinical staff submitted that PA yesterday and we are waiting to hear back from his insurance. He voiced understanding. ?

## 2021-08-07 NOTE — Telephone Encounter (Signed)
Phone call to patient to inform him that his insurance company denied his Clobetasol Foam and that we can send in the prescription to a different Pharmacy and he can pay cash for the prescription.  ?

## 2021-08-07 NOTE — Telephone Encounter (Signed)
Phone call from patient returning my call. I informed patient that he could pay cash for the clobetasol foam. Patient aware and states he will pay cash. Patient also wanted to know if his insurance will cover the [employer]?  I informed patient that we will get everything sent to [facility] and they will handle all of the approval for the [facility]. Patient aware.  ?

## 2021-08-07 NOTE — Progress Notes (Signed)
? ?Follow-Up Visit ?  ?Subjective  ?Juan [name] is a 56 y.o. male who presents for the following: Annual Exam (Psoriasis follow up Patient is taking [name]. Patient is flaring. Needs refills on all his medication even though it is not really working well anymore. It itches significantly and is spreading. Also wants to know if there is any thing for scaring. ). ? ? ?The following portions of the chart were reviewed this encounter and updated as appropriate:  Tobacco  Allergies  Meds  Problems  Med Hx  Surg Hx  Fam Hx   ?  ? ?Objective  ?Well appearing patient in no apparent distress; mood and affect are within normal limits. ? ?A full examination was performed including scalp, head, eyes, ears, nose, lips, neck, chest, axillae, abdomen, back, buttocks, bilateral upper extremities, bilateral lower extremities, hands, feet, fingers, toes, fingernails, and toenails. All findings within normal limits unless otherwise noted below. ? ?Gluteal Crease, Left Arm, Left Dorsal Hand, Left Lower Leg - Anterior, Left Posterior Auricle, Mid Parietal Scalp, Right Arm, Right Dorsal Hand, Right Eyebrow, Right Lower Leg - Anterior, Right Posterior Auricle ?Pink plaques and patches. Some are elevated with significant scale.  ? ? ?Assessment & Plan  ?Psoriasis vulgaris ?Left Lower Leg - Anterior; Right Lower Leg - Anterior; Gluteal Crease; Left Dorsal Hand; Right Dorsal Hand; Right Eyebrow; Left Posterior Auricle; Right Posterior Auricle; Mid Parietal Scalp; Left Arm; Right Arm ? ?Start skyrizi pending lab results.  ? ?Comprehensive metabolic panel - Gluteal Crease, Left Arm, Left Dorsal Hand, Left Lower Leg - Anterior, Left Posterior Auricle, Mid Parietal Scalp, Right Arm, Right Dorsal Hand, Right Eyebrow, Right Lower Leg - Anterior, Right Posterior Auricle ? ?CBC with Differential/Platelet - Gluteal Crease, Left Arm, Left Dorsal Hand, Left Lower Leg - Anterior, Left Posterior Auricle, Mid Parietal Scalp, Right Arm, Right  Dorsal Hand, Right Eyebrow, Right Lower Leg - Anterior, Right Posterior Auricle ? ?Hepatitis B surface antibody,quantitative - Gluteal Crease, Left Arm, Left Dorsal Hand, Left Lower Leg - Anterior, Left Posterior Auricle, Mid Parietal Scalp, Right Arm, Right Dorsal Hand, Right Eyebrow, Right Lower Leg - Anterior, Right Posterior Auricle ? ?Hepatitis B surface antigen - Gluteal Crease, Left Arm, Left Dorsal Hand, Left Lower Leg - Anterior, Left Posterior Auricle, Mid Parietal Scalp, Right Arm, Right Dorsal Hand, Right Eyebrow, Right Lower Leg - Anterior, Right Posterior Auricle ? ?Hepatitis B core antibody, total - Gluteal Crease, Left Arm, Left Dorsal Hand, Left Lower Leg - Anterior, Left Posterior Auricle, Mid Parietal Scalp, Right Arm, Right Dorsal Hand, Right Eyebrow, Right Lower Leg - Anterior, Right Posterior Auricle ? ?Hepatitis C antibody - Gluteal Crease, Left Arm, Left Dorsal Hand, Left Lower Leg - Anterior, Left Posterior Auricle, Mid Parietal Scalp, Right Arm, Right Dorsal Hand, Right Eyebrow, Right Lower Leg - Anterior, Right Posterior Auricle ? ?QuantiFERON-TB Gold Plus - Gluteal Crease, Left Arm, Left Dorsal Hand, Left Lower Leg - Anterior, Left Posterior Auricle, Mid Parietal Scalp, Right Arm, Right Dorsal Hand, Right Eyebrow, Right Lower Leg - Anterior, Right Posterior Auricle ? ?ANA - Gluteal Crease, Left Arm, Left Dorsal Hand, Left Lower Leg - Anterior, Left Posterior Auricle, Mid Parietal Scalp, Right Arm, Right Dorsal Hand, Right Eyebrow, Right Lower Leg - Anterior, Right Posterior Auricle ? ?Related Medications ?OTEZLA 30 MG TABS ?TAKE 1 TABLET BY MOUTH TWICE A DAY (NEED APPOINTMENT) ? ?clobetasol cream (TEMOVATE) 0.05 % ?Apply 1 application. topically 2 (two) times daily. 30 day supply ? ?Encounter for long-term (current) use  of medications ? ?Related Procedures ?Comprehensive metabolic panel ?CBC with Differential/Platelet ?Hepatitis B surface antibody,quantitative ?Hepatitis B surface  antigen ?Hepatitis B core antibody, total ?Hepatitis C antibody ?QuantiFERON-TB Gold Plus ?ANA ? ? ?Other Procedures Placed This Encounter ?Anti-nuclear ab-titer (ANA titer) ? ? ?I, Aldwin Micalizzi, PA-C, have reviewed all documentation's for this visit.  The documentation on 08/07/21 for the exam, diagnosis, procedures and orders are all accurate and complete. ?

## 2021-08-07 NOTE — Telephone Encounter (Signed)
Yes

## 2021-08-12 ENCOUNTER — Telehealth: Payer: Self-pay | Admitting: *Deleted

## 2021-08-12 NOTE — Telephone Encounter (Signed)
Labs okay per kelli sheffield- skyrizi new start done via [employer] portal and sent office notes via portal as well. Called patient to let him skyrizi sent to [employer] for approval.  ?

## 2021-08-19 ENCOUNTER — Telehealth: Payer: Self-pay | Admitting: *Deleted

## 2021-08-19 NOTE — Telephone Encounter (Signed)
Fax from [facility], they will transfer patients prescription Skyrizi to [facility] specialty.  ?

## 2021-09-11 IMAGING — CR DG KNEE COMPLETE 4+V*R*
4 series · 4 of 4 positions shown · non-contrast
Comparison: None.

CLINICAL DATA: 53-year-old male with intermittent right knee pain
for the past 2 years which has been worsening over the past 2
months.

EXAM:
RIGHT KNEE - COMPLETE 4+ VIEW

[t knee ap right]
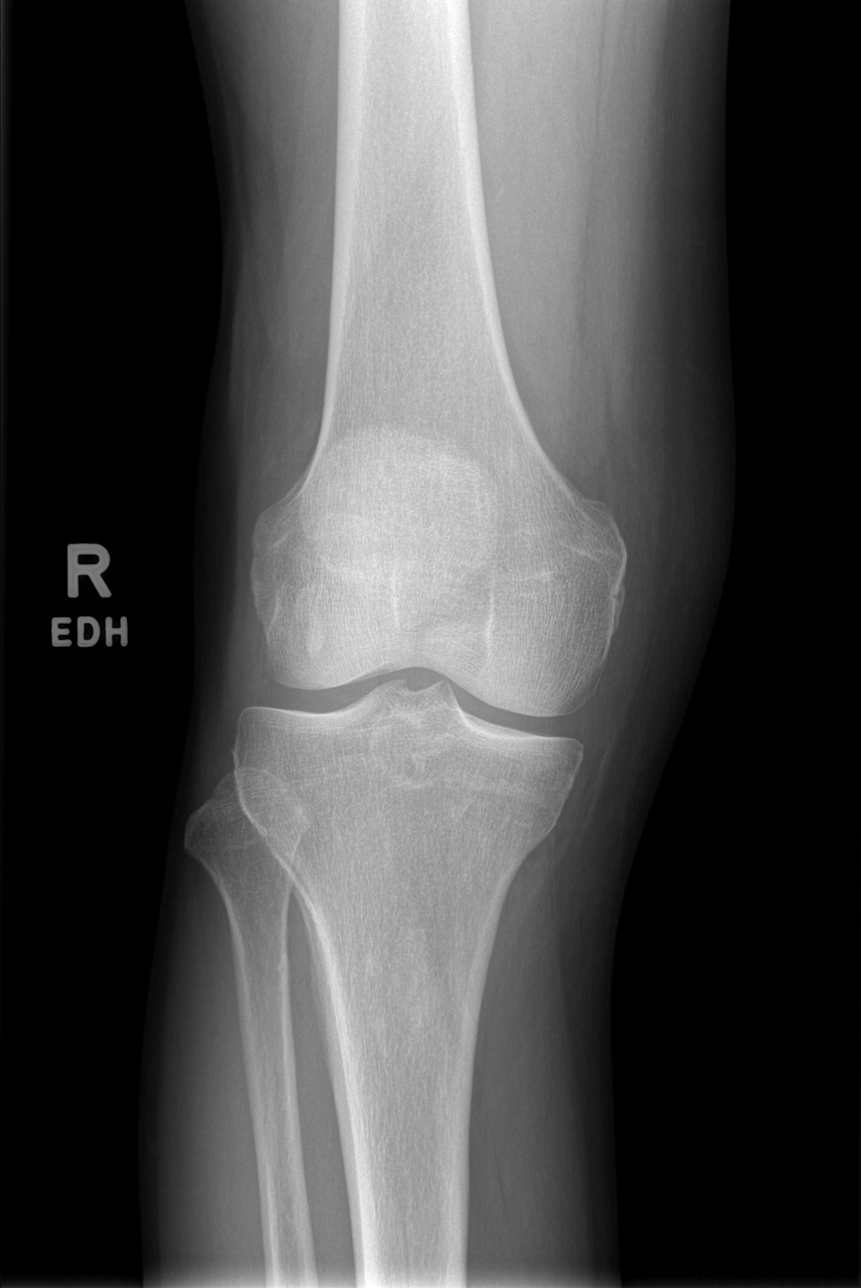

[t knee oblique right (1 of 2)]
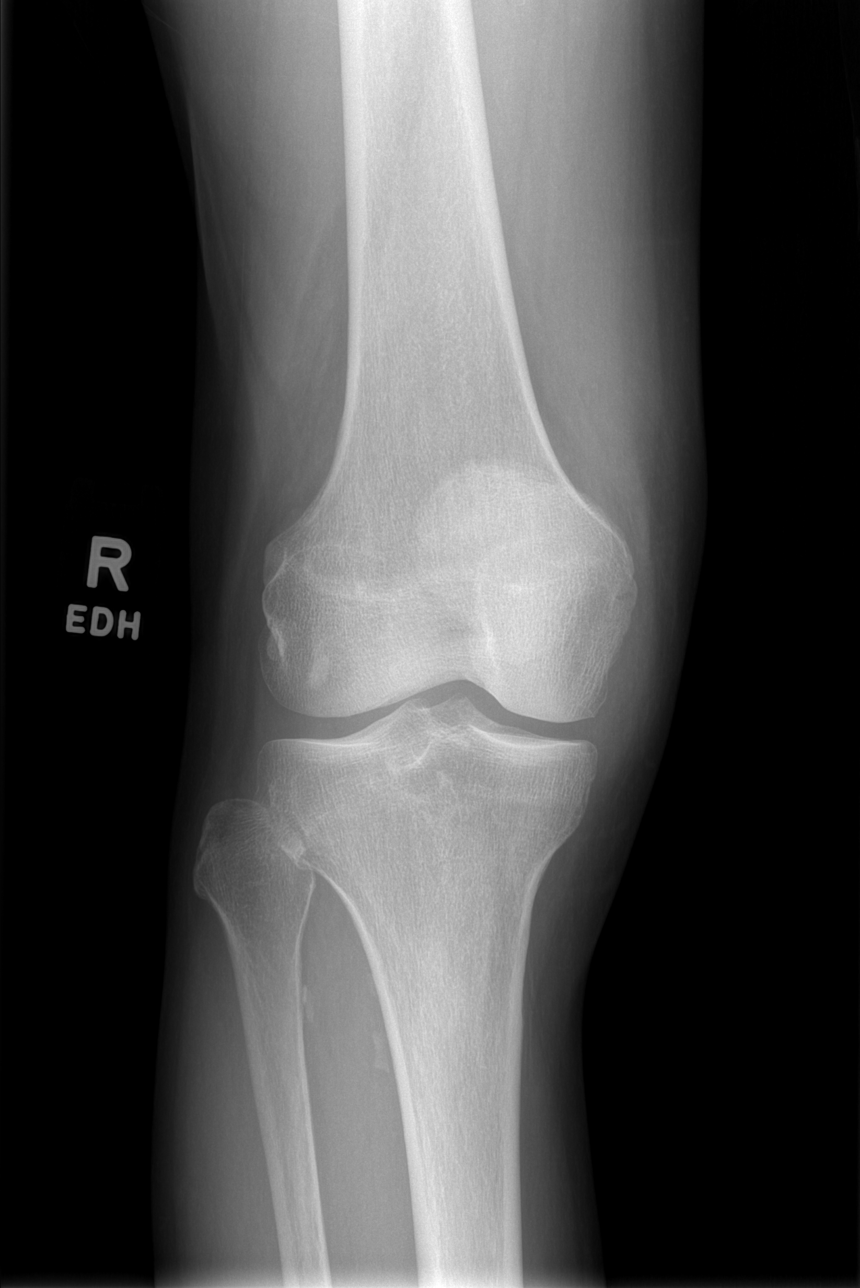

[t knee oblique right (2 of 2)]
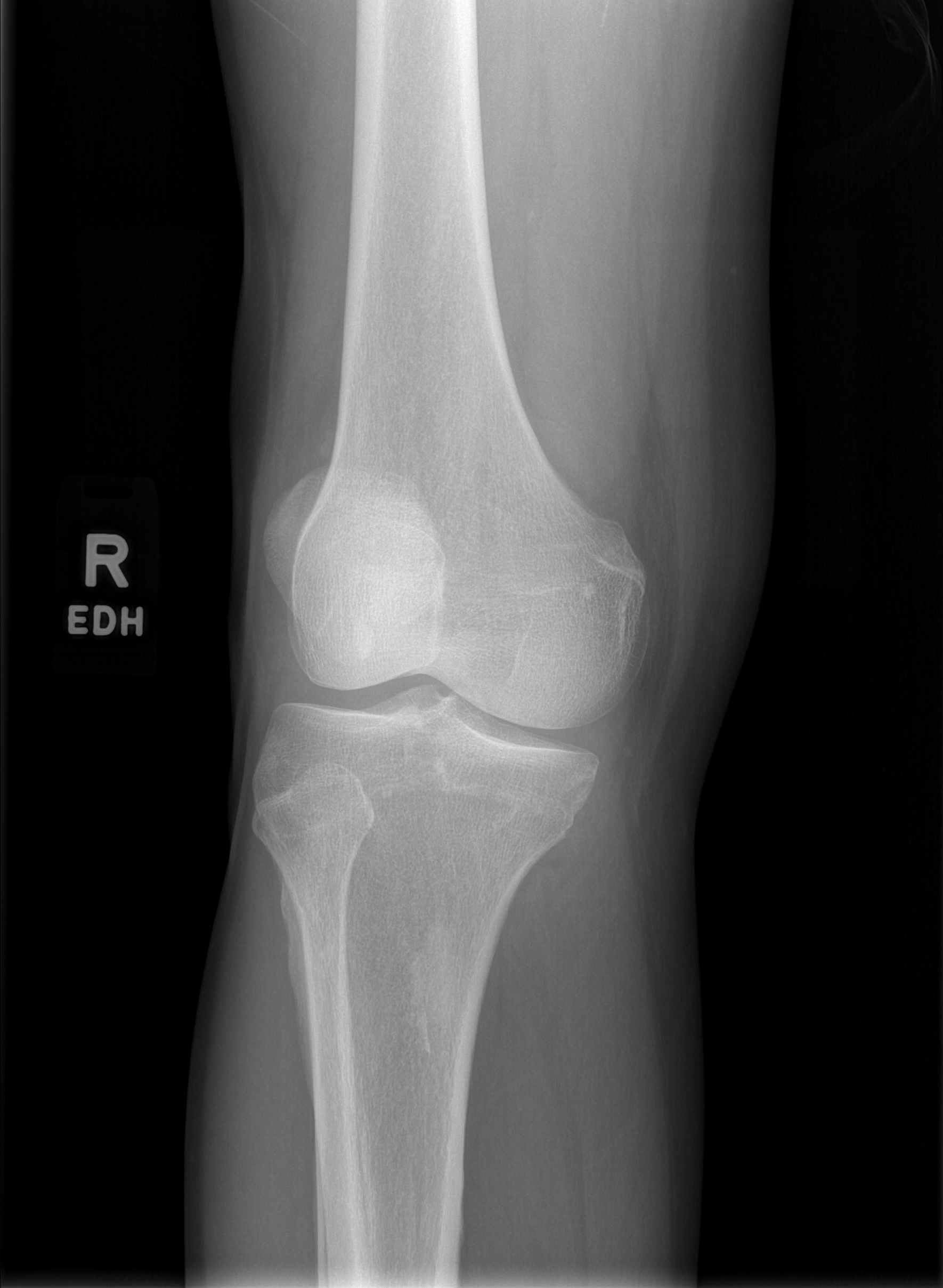

[t knee lat right]
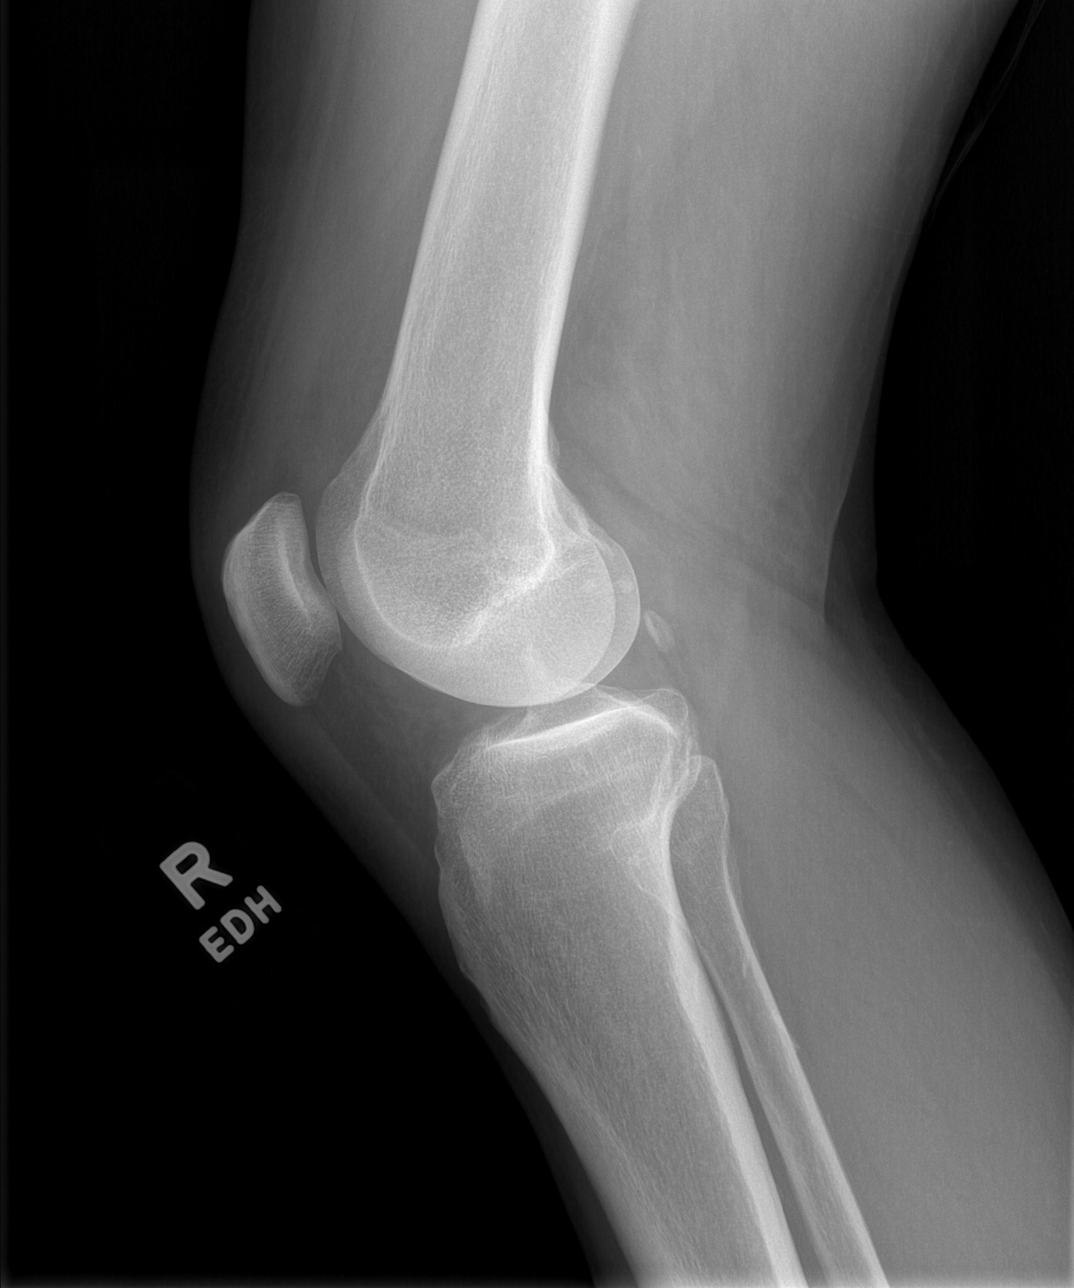

[4 of 4 positions shown; findings below may reference images not displayed]

FINDINGS: No evidence of fracture, dislocation, or joint effusion. No evidence
of arthropathy or other focal bone abnormality. Soft tissues are
unremarkable.
IMPRESSION: Negative.

## 2021-11-11 ENCOUNTER — Telehealth: Payer: Self-pay | Admitting: Physician Assistant

## 2021-11-11 DIAGNOSIS — L409 Psoriasis, unspecified: Secondary | ICD-10-CM

## 2021-11-11 MED ORDER — SKYRIZI PEN 150 MG/ML ~~LOC~~ SOAJ: SUBCUTANEOUS | 5 refills | Status: DC

## 2021-12-02 ENCOUNTER — Telehealth: Payer: Self-pay | Admitting: Physician Assistant

## 2021-12-02 DIAGNOSIS — L409 Psoriasis, unspecified: Secondary | ICD-10-CM

## 2021-12-02 NOTE — Telephone Encounter (Signed)
KRS pt on [employer]. Switched jobs, has Friday [employer], but Friday ends 7/31. Was supposed to get med from [facility] but wants to know what status is now. He figures it's best to go with [facility] with Friday until  7/31. [place] is active now also)

## 2021-12-03 MED ORDER — SKYRIZI PEN 150 MG/ML ~~LOC~~ SOAJ: SUBCUTANEOUS | 5 refills | Status: DC

## 2021-12-03 NOTE — Telephone Encounter (Signed)
Phone call to patient to inform him that we have sent the prescription over to [facility]. Voicemail left for patient.

## 2021-12-24 ENCOUNTER — Telehealth: Payer: Self-pay | Admitting: *Deleted

## 2021-12-24 DIAGNOSIS — L409 Psoriasis, unspecified: Secondary | ICD-10-CM

## 2021-12-24 MED ORDER — SKYRIZI PEN 150 MG/ML ~~LOC~~ SOAJ: SUBCUTANEOUS | 5 refills | Status: DC

## 2021-12-24 NOTE — Telephone Encounter (Signed)
Fax from [facility] stating skyrizi approved 12/23/21 - 12/24/22. The prescription drug plan requires medication to be filled through [facility] specialty. Send new prescription to [facility] specialty pharmacy.

## 2022-04-02 ENCOUNTER — Ambulatory Visit (INDEPENDENT_AMBULATORY_CARE_PROVIDER_SITE_OTHER): Payer: 59 | Admitting: Dermatology

## 2022-04-02 DIAGNOSIS — L700 Acne vulgaris: Secondary | ICD-10-CM

## 2022-04-02 DIAGNOSIS — D229 Melanocytic nevi, unspecified: Secondary | ICD-10-CM

## 2022-04-02 DIAGNOSIS — L719 Rosacea, unspecified: Secondary | ICD-10-CM

## 2022-04-02 DIAGNOSIS — Z1283 Encounter for screening for malignant neoplasm of skin: Secondary | ICD-10-CM | POA: Diagnosis not present

## 2022-04-02 DIAGNOSIS — L409 Psoriasis, unspecified: Secondary | ICD-10-CM | POA: Diagnosis not present

## 2022-04-02 DIAGNOSIS — L905 Scar conditions and fibrosis of skin: Secondary | ICD-10-CM | POA: Diagnosis not present

## 2022-04-02 DIAGNOSIS — L578 Other skin changes due to chronic exposure to nonionizing radiation: Secondary | ICD-10-CM

## 2022-04-02 DIAGNOSIS — L814 Other melanin hyperpigmentation: Secondary | ICD-10-CM

## 2022-04-02 DIAGNOSIS — D692 Other nonthrombocytopenic purpura: Secondary | ICD-10-CM

## 2022-04-02 DIAGNOSIS — L821 Other seborrheic keratosis: Secondary | ICD-10-CM

## 2022-04-02 NOTE — Patient Instructions (Signed)
Due to recent changes in healthcare laws, you may see results of your pathology and/or laboratory studies on MyChart before the doctors have had a chance to review them. We understand that in some cases there may be results that are confusing or concerning to you. Please understand that not all results are received at the same time and often the doctors may need to interpret multiple results in order to provide you with the best plan of care or course of treatment. Therefore, we ask that you please give us 2 business days to thoroughly review all your results before contacting the office for clarification. Should we see a critical lab result, you will be contacted sooner.   If You Need Anything After Your Visit  If you have any questions or concerns for your doctor, please call our main line at [phone] and press option 4 to reach your doctor's medical assistant. If no one answers, please leave a voicemail as directed and we will return your call as soon as possible. Messages left after 4 pm will be answered the following business day.   You may also send us a message via MyChart. We typically respond to MyChart messages within 1-2 business days.  For prescription refills, please ask your pharmacy to contact our office. Our fax number is [phone].  If you have an urgent issue when the clinic is closed that cannot wait until the next business day, you can page your doctor at the number below.    Please note that while we do our best to be available for urgent issues outside of office hours, we are not available 24/7.   If you have an urgent issue and are unable to reach us, you may choose to seek medical care at your doctor's office, retail clinic, urgent care center, or emergency room.  If you have a medical emergency, please immediately call 911 or go to the emergency department.  Pager Numbers  - Dr. Kowalski: [phone]  - Dr. Moye: [phone]  - Dr. Stewart:  [phone]  In the event of inclement weather, please call our main line at [phone] for an update on the status of any delays or closures.  Dermatology Medication Tips: Please keep the boxes that topical medications come in in order to help keep track of the instructions about where and how to use these. Pharmacies typically print the medication instructions only on the boxes and not directly on the medication tubes.   If your medication is too expensive, please contact our office at [phone] option 4 or send us a message through MyChart.   We are unable to tell what your co-pay for medications will be in advance as this is different depending on your insurance coverage. However, we may be able to find a substitute medication at lower cost or fill out paperwork to get insurance to cover a needed medication.   If a prior authorization is required to get your medication covered by your insurance company, please allow us 1-2 business days to complete this process.  Drug prices often vary depending on where the prescription is filled and some pharmacies may offer cheaper prices.  The website www.goodrx.com contains coupons for medications through different pharmacies. The prices here do not account for what the cost may be with help from insurance (it may be cheaper with your insurance), but the website can give you the price if you did not use any insurance.  - You can print the associated coupon and take it with   your prescription to the pharmacy.  - You may also stop by our office during regular business hours and pick up a GoodRx coupon card.  - If you need your prescription sent electronically to a different pharmacy, notify our office through [facility] MyChart or by phone at [phone] option 4.     Si Usted Necesita Algo Despus de Su Visita  Tambin puede enviarnos un mensaje a travs de MyChart. Por lo general respondemos a los mensajes de MyChart en el transcurso de 1 a 2  das hbiles.  Para renovar recetas, por favor pida a su farmacia que se ponga en contacto con nuestra oficina. Nuestro nmero de fax es el [phone].  Si tiene un asunto urgente cuando la clnica est cerrada y que no puede esperar hasta el siguiente da hbil, puede llamar/localizar a su doctor(a) al nmero que aparece a continuacin.   Por favor, tenga en cuenta que aunque hacemos todo lo posible para estar disponibles para asuntos urgentes fuera del horario de oficina, no estamos disponibles las 24 horas del da, los 7 das de la semana.   Si tiene un problema urgente y no puede comunicarse con nosotros, puede optar por buscar atencin mdica  en el consultorio de su doctor(a), en una clnica privada, en un centro de atencin urgente o en una sala de emergencias.  Si tiene una emergencia mdica, por favor llame inmediatamente al 911 o vaya a la sala de emergencias.  Nmeros de bper  - Dr. Kowalski: [phone]  - Dra. Moye: [phone]  - Dra. Stewart: [phone]  En caso de inclemencias del tiempo, por favor llame a nuestra lnea principal al [phone] para una actualizacin sobre el estado de cualquier retraso o cierre.  Consejos para la medicacin en dermatologa: Por favor, guarde las cajas en las que vienen los medicamentos de uso tpico para ayudarle a seguir las instrucciones sobre dnde y cmo usarlos. Las farmacias generalmente imprimen las instrucciones del medicamento slo en las cajas y no directamente en los tubos del medicamento.   Si su medicamento es muy caro, por favor, pngase en contacto con nuestra oficina llamando al [phone] y presione la opcin 4 o envenos un mensaje a travs de MyChart.   No podemos decirle cul ser su copago por los medicamentos por adelantado ya que esto es diferente dependiendo de la cobertura de su seguro. Sin embargo, es posible que podamos encontrar un medicamento sustituto a menor costo o llenar un formulario para que el  seguro cubra el medicamento que se considera necesario.   Si se requiere una autorizacin previa para que su compaa de seguros cubra su medicamento, por favor permtanos de 1 a 2 das hbiles para completar este proceso.  Los precios de los medicamentos varan con frecuencia dependiendo del lugar de dnde se surte la receta y alguna farmacias pueden ofrecer precios ms baratos.  El sitio web www.goodrx.com tiene cupones para medicamentos de diferentes farmacias. Los precios aqu no tienen en cuenta lo que podra costar con la ayuda del seguro (puede ser ms barato con su seguro), pero el sitio web puede darle el precio si no utiliz ningn seguro.  - Puede imprimir el cupn correspondiente y llevarlo con su receta a la farmacia.  - Tambin puede pasar por nuestra oficina durante el horario de atencin regular y recoger una tarjeta de cupones de GoodRx.  - Si necesita que su receta se enve electrnicamente a una farmacia diferente, informe a nuestra oficina a travs de MyChart de [place]   o por telfono llamando al [phone] y presione la opcin 4.  

## 2022-04-02 NOTE — Progress Notes (Signed)
New Patient Visit  Subjective  Juan [name] is a 56 y.o. male who presents for the following: Psoriasis (Previously on [name] which worked well at first but then he started flaring again. Pt currently using Skyrizi and had his last injection in August. He is doing well on Skyrizi and has Clobetasol cream QD PRN.) and Scarring (On the arms from serious accident. He would like to discuss treatment options). The patient presents for Upper Body Skin Exam (UBSE) for skin cancer screening and mole check.  The patient has spots, moles and lesions to be evaluated, some may be new or changing.  The following portions of the chart were reviewed this encounter and updated as appropriate:   Tobacco  Allergies  Meds  Problems  Med Hx  Surg Hx  Fam Hx     Review of Systems:  No other skin or systemic complaints except as noted in HPI or Assessment and Plan.  Objective  Well appearing patient in no apparent distress; mood and affect are within normal limits.  A focused examination was performed including the face and arms. Relevant physical exam findings are noted in the Assessment and Plan.  B/L arms Dyspigmented smooth macule or patch.        Face, scalp, extremities           Face Mid face erythema with telangiectasias +/- scattered inflammatory papules.   R scalp Dark purple macule.       Assessment & Plan  Scarring B/L arms Will ask Dr. Laurence Ferrari to review photos to see if she has any recommendations.   Psoriasis Face, scalp, extremities Has tried and failed [name] - reviewed labs from 07/2021, WNL  Psoriasis is a chronic non-curable, but treatable genetic/hereditary disease that may have other systemic features affecting other organ systems such as joints (Psoriatic Arthritis). It is associated with an increased risk of inflammatory bowel disease, heart disease, non-alcoholic fatty liver disease, and depression.    Continue Skyrizi injections Q3M. Reviewed risks  of biologics including immunosuppression, infections, injection site reaction, and failure to improve condition. Goal is control of skin condition, not cure.  Some older biologics such as Humira and Enbrel may slightly increase risk of malignancy and may worsen congestive heart failure.  Talz and Cosentyx may cause inflammatory bowel disease to flare. The use of biologics requires long term medication management, including periodic office visits and monitoring of blood work.  Ok to have Covid vaccine.   Continue Clobetasol cream to aa's QD-BID PRN. Topical steroids (such as triamcinolone, fluocinolone, fluocinonide, mometasone, clobetasol, halobetasol, betamethasone, hydrocortisone) can cause thinning and lightening of the skin if they are used for too long in the same area. Your physician has selected the right strength medicine for your problem and area affected on the body. Please use your medication only as directed by your physician to prevent side effects.   Related Medications SKYRIZI PEN 150 MG/ML SOAJ Inject 150 mg SQ every 12 weeks.  Rosacea Face Rosacea is a chronic progressive skin condition usually affecting the face of adults, causing redness and/or acne bumps. It is treatable but not curable. It sometimes affects the eyes (ocular rosacea) as well. It may respond to topical and/or systemic medication and can flare with stress, sun exposure, alcohol, exercise, topical steroids (including hydrocortisone/cortisone 10) and some foods.  Daily application of broad spectrum spf 30+ sunscreen to face is recommended to reduce flares.  Discussed the treatment option of BBL/laser.  Typically we recommend 1-3 treatment sessions about  5-8 weeks apart for best results.  The patient's condition may require "maintenance treatments" in the future.  The fee for BBL / laser treatments is $350 per treatment session for the whole face.  A fee can be quoted for other parts of the body. Insurance typically  does not pay for BBL/laser treatments and therefore the fee is an out-of-pocket cost.  Comedone R scalp In association with hemangioma -  Benign-appearing.  Observation.  Call clinic for new or changing lesions.  Recommend daily use of broad spectrum spf 30+ sunscreen to sun-exposed areas.   Lentigines - Scattered tan macules - Due to sun exposure - Benign-appearing, observe - Recommend daily broad spectrum sunscreen SPF 30+ to sun-exposed areas, reapply every 2 hours as needed. - Call for any changes  Seborrheic Keratoses - Stuck-on, waxy, tan-brown papules and/or plaques  - Benign-appearing - Discussed benign etiology and prognosis. - Observe - Call for any changes  Melanocytic Nevi - Tan-brown and/or pink-flesh-colored symmetric macules and papules - Benign appearing on exam today - Observation - Call clinic for new or changing moles - Recommend daily use of broad spectrum spf 30+ sunscreen to sun-exposed areas.   Hemangiomas - Red papules - Discussed benign nature - Observe - Call for any changes  Actinic Damage - Chronic condition, secondary to cumulative UV/sun exposure - diffuse scaly erythematous macules with underlying dyspigmentation - Recommend daily broad spectrum sunscreen SPF 30+ to sun-exposed areas, reapply every 2 hours as needed.  - Staying in the shade or wearing long sleeves, sun glasses (UVA+UVB protection) and wide brim hats (4-inch brim around the entire circumference of the hat) are also recommended for sun protection.  - Call for new or changing lesions.  Purpura - Chronic; persistent and recurrent.  Treatable, but not curable. - Violaceous macules and patches - Benign - Related to trauma, age, sun damage and/or use of blood thinners, chronic use of topical and/or oral steroids - Observe - Can use OTC arnica containing moisturizer such as Dermend Bruise Formula if desired - Call for worsening or other concerns  Skin cancer screening performed  today.  Return in about 4 months (around 08/01/2022) for psoriasis f/u.  Luther Redo, CMA, am acting as scribe for Sarina Ser, MD . Documentation: I have reviewed the above documentation for accuracy and completeness, and I agree with the above.  Sarina Ser, MD

## 2022-04-07 ENCOUNTER — Telehealth: Payer: Self-pay

## 2022-04-07 NOTE — Telephone Encounter (Signed)
-----   Message from Ralene Bathe, MD sent at 04/03/2022  2:52 PM EST ----- OK, he can schedule $350 per treatment.  He may have too much tan right now -- needs to be lightest he can be for treatment.  May schedule (make sure he knows is out of pocket - not covered by insurance) ----- Message ----- From: Rosaria Ferries, CMA Sent: 04/03/2022   7:46 AM EST To: Ralene Bathe, MD  Sorry I didn't mention what he wanted BBL for. It's for the rosacea on the face and not the scarring on his arms.  ----- Message ----- From: Ralene Bathe, MD Sent: 04/02/2022   5:19 PM EST To: Rosaria Ferries, CMA  I just mentioned that he may be a candidate for Laser treatmetn for his scars of his arms and we could review with DR MOYE.  I also told him I did not think any treatment would help much though. So document as per this commentary.  We can send his info to Dr [facility] when chart signed, but I do not recommend laser at this time.  I told him we would discuss at his next visit. ----- Message ----- From: Rosaria Ferries, CMA Sent: 04/02/2022  11:24 AM EST To: Ralene Bathe, MD  Patient had questions about BBL after you left the room. I explained that cost, the need for multiple treatment to see best results, and that procedure is not covered by insurance. What are your recommendations and would you like for me to schedule patient? Thanks.

## 2022-04-07 NOTE — Telephone Encounter (Signed)
Left message on voicemail to return my call.  

## 2022-04-08 NOTE — Telephone Encounter (Signed)
Patient advised and scheduled. aw

## 2022-04-09 ENCOUNTER — Other Ambulatory Visit: Payer: Self-pay

## 2022-04-09 DIAGNOSIS — L409 Psoriasis, unspecified: Secondary | ICD-10-CM

## 2022-04-09 MED ORDER — SKYRIZI PEN 150 MG/ML ~~LOC~~ SOAJ: SUBCUTANEOUS | 1 refills | Status: DC

## 2022-04-09 NOTE — Progress Notes (Signed)
Patient called the office regarding [employer] and needing a new PA so he can order medication from [place]. RX sent in under Dr. Alveria Apley name.  Will complete PA on Cover My Meds. aw

## 2022-04-15 ENCOUNTER — Encounter: Payer: Self-pay | Admitting: Dermatology

## 2022-07-07 ENCOUNTER — Telehealth: Payer: Self-pay

## 2022-07-07 NOTE — Telephone Encounter (Signed)
LM on VM, we have  an opening for BBL in the office today 2/19 at 2pm with Dr Nicole Kindred, patient scheduled with Dr Raliegh Ip 08/06/22- Dr Raliegh Ip will be out the office on that dar

## 2022-07-22 ENCOUNTER — Telehealth: Payer: Self-pay

## 2022-07-22 MED ORDER — VALACYCLOVIR HCL 1 G PO TABS: 1000.0000 mg | ORAL_TABLET | ORAL | 11 refills | Status: DC

## 2022-07-22 MED ORDER — VALACYCLOVIR HCL 500 MG PO TABS: 500.0000 mg | ORAL_TABLET | Freq: Two times a day (BID) | ORAL | 3 refills | Status: AC

## 2022-07-22 NOTE — Telephone Encounter (Signed)
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 Pt also needed his regular prescription of Valtrex 1 gram for when he gets fever blisters.  Advised pt I would send in Valtrex '500mg'$  1 po bid for 7 days starting 1 day prior to BBL, and I would send in Valtrex 1g take 2 po at onset of fever blisters and then 2 po 12 hours later.  Both have been sent into [facility] [address] [employer]

## 2022-07-24 ENCOUNTER — Ambulatory Visit (INDEPENDENT_AMBULATORY_CARE_PROVIDER_SITE_OTHER): Payer: 59 | Admitting: Dermatology

## 2022-07-24 DIAGNOSIS — Z79899 Other long term (current) drug therapy: Secondary | ICD-10-CM | POA: Diagnosis not present

## 2022-07-24 DIAGNOSIS — L409 Psoriasis, unspecified: Secondary | ICD-10-CM | POA: Diagnosis not present

## 2022-07-24 DIAGNOSIS — L719 Rosacea, unspecified: Secondary | ICD-10-CM | POA: Diagnosis not present

## 2022-07-24 MED ORDER — VTAMA 1 % EX CREA: 1.0000 "application " | TOPICAL_CREAM | Freq: Every day | CUTANEOUS | 3 refills | Status: DC

## 2022-07-24 MED ORDER — VTAMA 1 % EX CREA: 1.0000 "application " | TOPICAL_CREAM | Freq: Every day | CUTANEOUS | 4 refills | Status: DC

## 2022-07-28 ENCOUNTER — Encounter: Payer: Self-pay | Admitting: Dermatology

## 2022-07-28 NOTE — Patient Instructions (Signed)
Due to recent changes in healthcare laws, you may see results of your pathology and/or laboratory studies on MyChart before the doctors have had a chance to review them. We understand that in some cases there may be results that are confusing or concerning to you. Please understand that not all results are received at the same time and often the doctors may need to interpret multiple results in order to provide you with the best plan of care or course of treatment. Therefore, we ask that you please give us 2 business days to thoroughly review all your results before contacting the office for clarification. Should we see a critical lab result, you will be contacted sooner.   If You Need Anything After Your Visit  If you have any questions or concerns for your doctor, please call our main line at [phone] and press option 4 to reach your doctor's medical assistant. If no one answers, please leave a voicemail as directed and we will return your call as soon as possible. Messages left after 4 pm will be answered the following business day.   You may also send us a message via MyChart. We typically respond to MyChart messages within 1-2 business days.  For prescription refills, please ask your pharmacy to contact our office. Our fax number is [phone].  If you have an urgent issue when the clinic is closed that cannot wait until the next business day, you can page your doctor at the number below.    Please note that while we do our best to be available for urgent issues outside of office hours, we are not available 24/7.   If you have an urgent issue and are unable to reach us, you may choose to seek medical care at your doctor's office, retail clinic, urgent care center, or emergency room.  If you have a medical emergency, please immediately call 911 or go to the emergency department.  Pager Numbers  - Dr. Kowalski: [phone]  - Dr. Moye: [phone]  - Dr. Stewart:  [phone]  In the event of inclement weather, please call our main line at [phone] for an update on the status of any delays or closures.  Dermatology Medication Tips: Please keep the boxes that topical medications come in in order to help keep track of the instructions about where and how to use these. Pharmacies typically print the medication instructions only on the boxes and not directly on the medication tubes.   If your medication is too expensive, please contact our office at [phone] option 4 or send us a message through MyChart.   We are unable to tell what your co-pay for medications will be in advance as this is different depending on your insurance coverage. However, we may be able to find a substitute medication at lower cost or fill out paperwork to get insurance to cover a needed medication.   If a prior authorization is required to get your medication covered by your insurance company, please allow us 1-2 business days to complete this process.  Drug prices often vary depending on where the prescription is filled and some pharmacies may offer cheaper prices.  The website www.goodrx.com contains coupons for medications through different pharmacies. The prices here do not account for what the cost may be with help from insurance (it may be cheaper with your insurance), but the website can give you the price if you did not use any insurance.  - You can print the associated coupon and take it with   your prescription to the pharmacy.  - You may also stop by our office during regular business hours and pick up a GoodRx coupon card.  - If you need your prescription sent electronically to a different pharmacy, notify our office through [facility] MyChart or by phone at [phone] option 4.     Si Usted Necesita Algo Despus de Su Visita  Tambin puede enviarnos un mensaje a travs de MyChart. Por lo general respondemos a los mensajes de MyChart en el transcurso de 1 a 2  das hbiles.  Para renovar recetas, por favor pida a su farmacia que se ponga en contacto con nuestra oficina. Nuestro nmero de fax es el [phone].  Si tiene un asunto urgente cuando la clnica est cerrada y que no puede esperar hasta el siguiente da hbil, puede llamar/localizar a su doctor(a) al nmero que aparece a continuacin.   Por favor, tenga en cuenta que aunque hacemos todo lo posible para estar disponibles para asuntos urgentes fuera del horario de oficina, no estamos disponibles las 24 horas del da, los 7 das de la semana.   Si tiene un problema urgente y no puede comunicarse con nosotros, puede optar por buscar atencin mdica  en el consultorio de su doctor(a), en una clnica privada, en un centro de atencin urgente o en una sala de emergencias.  Si tiene una emergencia mdica, por favor llame inmediatamente al 911 o vaya a la sala de emergencias.  Nmeros de bper  - Dr. Kowalski: [phone]  - Dra. Moye: [phone]  - Dra. Stewart: [phone]  En caso de inclemencias del tiempo, por favor llame a nuestra lnea principal al [phone] para una actualizacin sobre el estado de cualquier retraso o cierre.  Consejos para la medicacin en dermatologa: Por favor, guarde las cajas en las que vienen los medicamentos de uso tpico para ayudarle a seguir las instrucciones sobre dnde y cmo usarlos. Las farmacias generalmente imprimen las instrucciones del medicamento slo en las cajas y no directamente en los tubos del medicamento.   Si su medicamento es muy caro, por favor, pngase en contacto con nuestra oficina llamando al [phone] y presione la opcin 4 o envenos un mensaje a travs de MyChart.   No podemos decirle cul ser su copago por los medicamentos por adelantado ya que esto es diferente dependiendo de la cobertura de su seguro. Sin embargo, es posible que podamos encontrar un medicamento sustituto a menor costo o llenar un formulario para que el  seguro cubra el medicamento que se considera necesario.   Si se requiere una autorizacin previa para que su compaa de seguros cubra su medicamento, por favor permtanos de 1 a 2 das hbiles para completar este proceso.  Los precios de los medicamentos varan con frecuencia dependiendo del lugar de dnde se surte la receta y alguna farmacias pueden ofrecer precios ms baratos.  El sitio web www.goodrx.com tiene cupones para medicamentos de diferentes farmacias. Los precios aqu no tienen en cuenta lo que podra costar con la ayuda del seguro (puede ser ms barato con su seguro), pero el sitio web puede darle el precio si no utiliz ningn seguro.  - Puede imprimir el cupn correspondiente y llevarlo con su receta a la farmacia.  - Tambin puede pasar por nuestra oficina durante el horario de atencin regular y recoger una tarjeta de cupones de GoodRx.  - Si necesita que su receta se enve electrnicamente a una farmacia diferente, informe a nuestra oficina a travs de MyChart de [place]   o por telfono llamando al [phone] y presione la opcin 4.  

## 2022-07-28 NOTE — Progress Notes (Signed)
   Follow-Up Visit   Subjective  Juan [name] is a 57 y.o. male who presents for the following: Rosacea (BBL today) and Psoriasis (4 month follow up - Skyrizi injection and clobetasol to spot treat areas. Failed Otezla in the past).  The following portions of the chart were reviewed this encounter and updated as appropriate:   Tobacco  Allergies  Meds  Problems  Med Hx  Surg Hx  Fam Hx     Review of Systems:  No other skin or systemic complaints except as noted in HPI or Assessment and Plan.  Objective  Well appearing patient in no apparent distress; mood and affect are within normal limits.  A focused examination was performed including trunk, extremities. Relevant physical exam findings are noted in the Assessment and Plan.  Face Erythema and dilated blood vessels                Assessment & Plan  Psoriasis Better controlled. Pt doing well.  But Not to goal. Counseling on psoriasis and coordination of care  psoriasis is a chronic non-curable, but treatable genetic/hereditary disease that may have other systemic features affecting other organ systems such as joints (Psoriatic Arthritis). It is associated with an increased risk of inflammatory bowel disease, heart disease, non-alcoholic fatty liver disease, and depression.  Treatments include light and laser treatments; topical medications; and systemic medications including oral and injectables.  Continue Skyrizi injections every 3 months Start Vtama cream daily  Will order [employer] today and plan other labs on follow up if not performed by his PCP  Tapinarof (VTAMA) 1 % CREA Apply 1 application  topically daily.  QuantiFERON-TB Gold Plus  Related Medications SKYRIZI PEN 150 MG/ML SOAJ Inject 150 mg SQ every 12 weeks.  Rosacea Face  Photorejuvenation - Face Prior to the procedure, the patient's past medical history, medications, allergies, and the rare but potential risks and complications  were reviewed with the patient and a signed consent was obtained.  Pre and post treatment care was discussed and instructions provided.   [place] BBL - 07/28/22 0800      Patient Details   Skin Type: II    Anesthestic Cream Applied: No    Photo Takes: Yes    Consent Signed: Yes      Treatment Details   Date: 07/24/22    Treatment #: 1    Area: Face    Filter: 1st Pass      1st Pass   Location: F    Device: 560    BBL j/cm2: 27    PW Msec Sec: 26    Cooling Temp: 20    Pulses: 109     Patient tolerated the procedure well. Face mask and laser kit given today.  Nancy Fetter avoidance was stressed. The patient will call with any problems, questions or concerns prior to their next appointment.  Return for BBL.  I, Ashok Cordia, CMA, am acting as scribe for Sarina Ser, MD . Documentation: I have reviewed the above documentation for accuracy and completeness, and I agree with the above.  Sarina Ser, MD

## 2022-08-04 ENCOUNTER — Ambulatory Visit: Payer: 59 | Admitting: Dermatology

## 2022-08-05 ENCOUNTER — Ambulatory Visit: Payer: 59 | Admitting: Physician Assistant

## 2022-08-06 ENCOUNTER — Ambulatory Visit: Payer: 59 | Admitting: Dermatology
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
## 2022-10-26 LAB — QUANTIFERON-TB GOLD PLUS
Mitogen-NIL: 8.07 IU/mL
NIL: 0.02 IU/mL
QuantiFERON-TB Gold Plus: NEGATIVE
TB1-NIL: 0.02 IU/mL
TB2-NIL: 0.02 IU/mL

## 2022-10-28 ENCOUNTER — Telehealth: Payer: Self-pay

## 2022-10-28 NOTE — Telephone Encounter (Signed)
LM on VM tb  TB skin test labs-negative continue [employer]

## 2022-10-28 NOTE — Telephone Encounter (Signed)
-----   Message from Deirdre Evener, MD sent at 10/28/2022 10:25 AM EDT ----- TB test = Quantiferon gold from 10/24/2022 was Negative = normal  Pt is on Skyrizi for Psoriasis He does not appear to have a follow up appt. - needs appt for September 2024 Please make pt appt

## 2022-10-30 ENCOUNTER — Ambulatory Visit: Payer: 59 | Admitting: Dermatology

## 2022-12-11 ENCOUNTER — Telehealth: Payer: Self-pay

## 2022-12-11 NOTE — Telephone Encounter (Signed)
Left message for patent to return my call.  1) Advise pt of negative TB result 2) Need copy of insurance card 3) Need to schedule September 2024 follow up.

## 2022-12-15 NOTE — Telephone Encounter (Signed)
Patient advised of negative TB test results.  Pt also advised I needed copy of insurance card and follow up scheduled. aw

## 2023-02-11 ENCOUNTER — Other Ambulatory Visit: Payer: Self-pay | Admitting: Dermatology

## 2023-02-11 DIAGNOSIS — L409 Psoriasis, unspecified: Secondary | ICD-10-CM

## 2023-02-18 ENCOUNTER — Encounter: Payer: Self-pay | Admitting: Dermatology

## 2023-02-18 ENCOUNTER — Ambulatory Visit: Payer: 59 | Admitting: Dermatology

## 2023-02-18 VITALS — BP 133/92

## 2023-02-18 DIAGNOSIS — Z5181 Encounter for therapeutic drug level monitoring: Secondary | ICD-10-CM

## 2023-02-18 DIAGNOSIS — L409 Psoriasis, unspecified: Secondary | ICD-10-CM | POA: Diagnosis not present

## 2023-02-18 DIAGNOSIS — Z79620 Long term (current) use of immunosuppressive biologic: Secondary | ICD-10-CM

## 2023-02-18 DIAGNOSIS — Z79899 Other long term (current) drug therapy: Secondary | ICD-10-CM

## 2023-02-18 DIAGNOSIS — Z7189 Other specified counseling: Secondary | ICD-10-CM

## 2023-02-18 MED ORDER — SKYRIZI PEN 150 MG/ML ~~LOC~~ SOAJ: 150.0000 mg | SUBCUTANEOUS | 2 refills | Status: DC

## 2023-02-18 NOTE — Patient Instructions (Signed)

## 2023-02-18 NOTE — Progress Notes (Signed)
   Follow-Up Visit   Subjective  Juan [name] is a 57 y.o. male who presents for the following: Psoriasis, arms, legs, hips, scalp Skyrizi q 78m, Vtama cr prn The following portions of the chart were reviewed this encounter and updated as appropriate: medications, allergies, medical history  Review of Systems:  No other skin or systemic complaints except as noted in HPI or Assessment and Plan.  Objective  Well appearing patient in no apparent distress; mood and affect are within normal limits.   A focused examination was performed of the following areas: Scalp, arms, legs  Relevant exam findings are noted in the Assessment and Plan.    Assessment & Plan   PSORIASIS on Systemic Treatment x 2 years - Skyrizi Arms, legs, hips, scalp Exam: one plaque left arm 1% BSA.  Chronic and persistent condition with duration or expected duration over one year. Condition is improving with treatment but not currently at goal.   patient denies joint pain  Labs Quantiferon TB 10/24/22 Pt will bring recent labs to view  Psoriasis - severe on systemic treatment.  Psoriasis is a chronic non-curable, but treatable genetic/hereditary disease that may have other systemic features affecting other organ systems such as joints (Psoriatic Arthritis).  It is linked with heart disease, inflammatory bowel disease, non-alcoholic fatty liver disease, and depression. Significant skin psoriasis and/or psoriatic arthritis may have significant symptoms and affects activities of daily activity and often benefits from systemic treatments.  These systemic treatments have some potential side effects including immunosuppression and require pre-treatment laboratory screening and periodic laboratory monitoring and periodic in person evaluation and monitoring by the attending dermatologist physician (long term medication management).   Reviewed risks of biologics including immunosuppression, infections, injection site  reaction, and failure to improve condition. Goal is control of skin condition, not cure.  Some older biologics such as Humira and Enbrel may slightly increase risk of malignancy and may worsen congestive heart failure.  Taltz and Cosentyx may cause inflammatory bowel disease to flare. The use of biologics requires long term medication management, including periodic office visits and monitoring of blood work.   Treatment Plan: Cont Skyrizi sq injections q 3 months Cont Vtama cr qd prn flares  Long term medication management.  Patient is using long term (months to years) prescription medication  to control their dermatologic condition.  These medications require periodic monitoring to evaluate for efficacy and side effects and may require periodic laboratory monitoring.      Return in about 6 months (around 08/19/2023) for TBSE, Psoriasis f/u.  I, Ardis Rowan, RMA, am acting as scribe for Armida Sans, MD .   Documentation: I have reviewed the above documentation for accuracy and completeness, and I agree with the above.  Armida Sans, MD

## 2023-03-11 ENCOUNTER — Telehealth: Payer: Self-pay

## 2023-03-11 NOTE — Telephone Encounter (Signed)
Patient called and left nurse voicemail that he recently scratched himself and the area is looking a little infected. Patient is asking can we send anything in for him? Mupirocin Ointment?

## 2023-03-12 MED ORDER — MUPIROCIN 2 % EX OINT: 1.0000 | TOPICAL_OINTMENT | Freq: Three times a day (TID) | CUTANEOUS | 0 refills | Status: DC

## 2023-03-12 NOTE — Telephone Encounter (Signed)
Confirmed [facility] [place]

## 2023-03-12 NOTE — Telephone Encounter (Signed)
Left message for patient to return my call. aw 

## 2023-03-22 ENCOUNTER — Emergency Department (HOSPITAL_COMMUNITY)
Admission: EM | Admit: 2023-03-22 | Discharge: 2023-03-23 | Disposition: A | Discharge status: Home / Self Care | Payer: 59 | Attending: Emergency Medicine | Admitting: Emergency Medicine

## 2023-03-22 ENCOUNTER — Emergency Department (HOSPITAL_COMMUNITY): Payer: 59

## 2023-03-22 ENCOUNTER — Other Ambulatory Visit: Payer: Self-pay

## 2023-03-22 DIAGNOSIS — W19XXXA Unspecified fall, initial encounter: Secondary | ICD-10-CM

## 2023-03-22 DIAGNOSIS — S0101XA Laceration without foreign body of scalp, initial encounter: Secondary | ICD-10-CM

## 2023-03-22 DIAGNOSIS — M542 Cervicalgia: Secondary | ICD-10-CM | POA: Diagnosis not present

## 2023-03-22 DIAGNOSIS — F10920 Alcohol use, unspecified with intoxication, uncomplicated: Secondary | ICD-10-CM

## 2023-03-22 DIAGNOSIS — I10 Essential (primary) hypertension: Secondary | ICD-10-CM | POA: Insufficient documentation

## 2023-03-22 DIAGNOSIS — Z79899 Other long term (current) drug therapy: Secondary | ICD-10-CM | POA: Diagnosis not present

## 2023-03-22 DIAGNOSIS — W01198A Fall on same level from slipping, tripping and stumbling with subsequent striking against other object, initial encounter: Secondary | ICD-10-CM | POA: Insufficient documentation

## 2023-03-22 DIAGNOSIS — Z23 Encounter for immunization: Secondary | ICD-10-CM | POA: Diagnosis not present

## 2023-03-22 LAB — I-STAT CHEM 8, ED
BUN: 6 mg/dL (ref 6–20)
Calcium, Ion: 0.98 mmol/L — ABNORMAL LOW (ref 1.15–1.40)
Chloride: 100 mmol/L (ref 98–111)
Creatinine, Ser: 1.1 mg/dL (ref 0.61–1.24)
Glucose, Bld: 97 mg/dL (ref 70–99)
HCT: 40 % (ref 39.0–52.0)
Hemoglobin: 13.6 g/dL (ref 13.0–17.0)
Potassium: 3.2 mmol/L — ABNORMAL LOW (ref 3.5–5.1)
Sodium: 139 mmol/L (ref 135–145)
TCO2: 24 mmol/L (ref 22–32)

## 2023-03-22 LAB — CBC
HCT: 39.2 % (ref 39.0–52.0)
Hemoglobin: 13.5 g/dL (ref 13.0–17.0)
MCH: 31.3 pg (ref 26.0–34.0)
MCHC: 34.4 g/dL (ref 30.0–36.0)
MCV: 90.7 fL (ref 80.0–100.0)
Platelets: 210 10*3/uL (ref 150–400)
RBC: 4.32 MIL/uL (ref 4.22–5.81)
RDW: 11.8 % (ref 11.5–15.5)
WBC: 5 10*3/uL (ref 4.0–10.5)
nRBC: 0 % (ref 0.0–0.2)

## 2023-03-22 LAB — COMPREHENSIVE METABOLIC PANEL WITH GFR
ALT: 21 U/L (ref 0–44)
AST: 32 U/L (ref 15–41)
Albumin: 3.5 g/dL (ref 3.5–5.0)
Alkaline Phosphatase: 87 U/L (ref 38–126)
Anion gap: 13 (ref 5–15)
BUN: 8 mg/dL (ref 6–20)
CO2: 23 mmol/L (ref 22–32)
Calcium: 8.3 mg/dL — ABNORMAL LOW (ref 8.9–10.3)
Chloride: 102 mmol/L (ref 98–111)
Creatinine, Ser: 0.84 mg/dL (ref 0.61–1.24)
GFR, Estimated: >60 mL/min
Glucose, Bld: 98 mg/dL (ref 70–99)
Potassium: 3.2 mmol/L — ABNORMAL LOW (ref 3.5–5.1)
Sodium: 138 mmol/L (ref 135–145)
Total Bilirubin: 0.6 mg/dL (ref 0.3–1.2)
Total Protein: 6.3 g/dL — ABNORMAL LOW (ref 6.5–8.1)

## 2023-03-22 LAB — RAPID URINE DRUG SCREEN, HOSP PERFORMED
Amphetamines: NOT DETECTED
Barbiturates: NOT DETECTED
Benzodiazepines: POSITIVE — AB
Cocaine: NOT DETECTED
Opiates: NOT DETECTED
Tetrahydrocannabinol: NOT DETECTED

## 2023-03-22 LAB — I-STAT CG4 LACTIC ACID, ED: Lactic Acid, Venous: 2.1 mmol/L (ref 0.5–1.9)

## 2023-03-22 LAB — URINALYSIS, ROUTINE W REFLEX MICROSCOPIC
Bilirubin Urine: NEGATIVE
Glucose, UA: NEGATIVE mg/dL
Hgb urine dipstick: NEGATIVE
Ketones, ur: NEGATIVE mg/dL
Leukocytes,Ua: NEGATIVE
Nitrite: NEGATIVE
Protein, ur: NEGATIVE mg/dL
Specific Gravity, Urine: 1.004 — ABNORMAL LOW (ref 1.005–1.030)
pH: 7 (ref 5.0–8.0)

## 2023-03-22 LAB — ETHANOL: Alcohol, Ethyl (B): 244 mg/dL — ABNORMAL HIGH (ref <10)

## 2023-03-22 MED ORDER — FENTANYL CITRATE PF 50 MCG/ML IJ SOSY
50.0000 ug | PREFILLED_SYRINGE | Freq: Once | INTRAMUSCULAR | Status: AC
  Administered 2023-03-22: 50 ug via INTRAVENOUS
  Filled 2023-03-22: qty 1

## 2023-03-22 MED ORDER — ONDANSETRON HCL 4 MG/2ML IJ SOLN
4.0000 mg | Freq: Once | INTRAMUSCULAR | Status: AC
  Administered 2023-03-22: 4 mg via INTRAVENOUS
  Filled 2023-03-22: qty 2

## 2023-03-22 MED ORDER — IOHEXOL 350 MG/ML SOLN
75.0000 mL | Freq: Once | INTRAVENOUS | Status: AC | PRN
  Administered 2023-03-22: 75 mL via INTRAVENOUS

## 2023-03-22 MED ORDER — TETANUS-DIPHTH-ACELL PERTUSSIS 5-2.5-18.5 LF-MCG/0.5 IM SUSY
0.5000 mL | PREFILLED_SYRINGE | Freq: Once | INTRAMUSCULAR | Status: AC
  Administered 2023-03-22: 0.5 mL via INTRAMUSCULAR
  Filled 2023-03-22: qty 0.5

## 2023-03-22 MED ORDER — THIAMINE HCL 100 MG/ML IJ SOLN
100.0000 mg | Freq: Once | INTRAMUSCULAR | Status: AC
  Administered 2023-03-22: 100 mg via INTRAVENOUS
  Filled 2023-03-22: qty 2

## 2023-03-22 MED ORDER — LIDOCAINE-EPINEPHRINE (PF) 2 %-1:200000 IJ SOLN
20.0000 mL | Freq: Once | INTRAMUSCULAR | Status: DC
  Filled 2023-03-22: qty 20

## 2023-03-22 MED ORDER — FENTANYL CITRATE PF 50 MCG/ML IJ SOSY
50.0000 ug | PREFILLED_SYRINGE | Freq: Once | INTRAMUSCULAR | Status: AC
  Administered 2023-03-23: 50 ug via INTRAVENOUS
  Filled 2023-03-22: qty 1
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
## 2023-03-22 NOTE — ED Provider Notes (Signed)
[facility] Provider Note   CSN: [id number] Arrival date & time: 03/22/23  2037     History {Add pertinent medical, surgical, social history, OB history to HPI:1} Chief Complaint  Patient presents with  . Fall    Juan [name] is a 57 y.o. male who arrives to the emergency department as an unlevel trauma.  Patient reports drinking 6 trulys tonight.  He reports he then decided to clean his floors and slipped falling backward and hitting his head on a wall.  Handoff taken by EMS at bedside.  Patient is currently on C-spine precautions.  He has been intoxicated but alert and oriented.  He did not lose consciousness.  He is complaining of neck and head pain.  EMS reports a large scalp laceration.  Bleeding is controlled.  Patient denies weakness or numbness in the upper extremities however he is currently intoxicated.  He has a history of traumatic brain injury and subarachnoid hemorrhage   Fall       Home Medications Prior to Admission medications   Medication Sig Start Date End Date Taking? Authorizing Provider  acetaminophen (TYLENOL) 500 MG tablet Take 2 tablets (1,000 mg total) by mouth every 8 (eight) hours as needed for mild pain. Patient not taking: Reported on 04/02/2022 02/18/20   Fritzi Mandes, MD  ALPRAZolam Prudy Feeler) 1 MG tablet Take 1 mg by mouth at bedtime.  08/30/19   [provider]  amLODipine (NORVASC) 5 MG tablet Take 5 mg by mouth daily. 09/14/19   [provider]  Calcipotriene-Betameth Diprop (ENSTILAR) 0.005-0.064 % FOAM Apply 1 application topically at bedtime. Patient not taking: Reported on 04/02/2022 02/27/20   Janalyn Harder, MD  clobetasol (OLUX) 0.05 % topical foam Apply to affected area qd. 30 day supply Patient not taking: Reported on 04/02/2022 08/01/21   Glyn Ade, PA-C  HYDROcodone-acetaminophen (NORCO) 7.5-325 MG tablet Take 1 tablet by mouth 2 (two) times daily. 02/28/20    [provider]  lisinopril (PRINIVIL,ZESTRIL) 40 MG tablet Take 40 mg by mouth daily.    [provider]  metoprolol succinate (TOPROL-XL) 25 MG 24 hr tablet Take 25 mg by mouth daily.    [provider]  mupirocin ointment (BACTROBAN) 2 % Apply 1 Application topically 3 (three) times daily. 03/12/23   Deirdre Evener, MD  OTEZLA 30 MG TABS TAKE 1 TABLET BY MOUTH TWICE A DAY (NEED APPOINTMENT) Patient not taking: Reported on 04/02/2022 11/27/20   Mackey Birchwood R, PA-C  polyethylene glycol (MIRALAX / GLYCOLAX) 17 g packet Take 17 g by mouth daily as needed for moderate constipation. Patient not taking: Reported on 04/02/2022 02/18/20   Fritzi Mandes, MD  risankizumab-rzaa [facility] PEN) 150 MG/ML pen Inject 1 mL (150 mg total) into the skin as directed. Every 12 weeks for maintenance. 02/18/23   Deirdre Evener, MD  sildenafil (REVATIO) 20 MG tablet Take 40 mg by mouth daily. 07/31/19   [provider]  sodium chloride (OCEAN) 0.65 % SOLN nasal spray Place 1 spray into both nostrils in the morning and at bedtime for 7 days. 02/18/20 03/07/20  Fritzi Mandes, MD  Tapinarof (VTAMA) 1 % CREA Apply 1 application  topically daily. 07/24/22   Deirdre Evener, MD  triamcinolone cream (KENALOG) 0.1 % Apply topically daily. Patient not taking: Reported on 04/02/2022 06/19/21   [provider]  triamcinolone ointment (KENALOG) 0.1 % Apply 1 application topically 2 (two) times daily as  needed (Rash). Patient not taking: Reported on 04/02/2022 02/27/20   Janalyn Harder, MD  valACYclovir (VALTREX) 1000 MG tablet Take 1 tablet (1,000 mg total) by mouth as directed. Take 2 po at onset of fever blisters and then 2 po 12 hours later 07/22/22   Deirdre Evener, MD      Allergies    Patient has no known allergies.    Review of Systems   Review of Systems  Physical Exam Updated Vital Signs BP (!) 152/95   Pulse 80   Temp (!) 97.5 F (36.4 C) (Oral)   Resp (!)  22   SpO2 96%  Physical Exam Vitals and nursing note reviewed.  Constitutional:      General: He is not in acute distress.    Appearance: He is well-developed. He is not diaphoretic.  HENT:     Head: Normocephalic.     Comments: Large scalp laceration Eyes:     General: No scleral icterus.    Extraocular Movements: Extraocular movements intact.     Conjunctiva/sclera: Conjunctivae normal.     Pupils: Pupils are equal, round, and reactive to light.  Cardiovascular:     Rate and Rhythm: Normal rate and regular rhythm.     Heart sounds: Normal heart sounds.  Pulmonary:     Effort: Pulmonary effort is normal. No respiratory distress.     Breath sounds: Normal breath sounds.  Abdominal:     Palpations: Abdomen is soft.     Tenderness: There is no abdominal tenderness.  Musculoskeletal:     Cervical back: Normal range of motion and neck supple.  Skin:    General: Skin is warm and dry.  Neurological:     GCS: GCS eye subscore is 4. GCS verbal subscore is 5. GCS motor subscore is 6.     Cranial Nerves: Cranial nerves 2-12 are intact.     Sensory: Sensation is intact.     Motor: Motor function is intact.  Psychiatric:        Behavior: Behavior normal.     ED Results / Procedures / Treatments   Labs (all labs ordered are listed, but only abnormal results are displayed) Labs Reviewed  COMPREHENSIVE METABOLIC PANEL - Abnormal; Notable for the following components:      Result Value   Potassium 3.2 (*)    Calcium 8.3 (*)    Total Protein 6.3 (*)    All other components within normal limits  ETHANOL - Abnormal; Notable for the following components:   Alcohol, Ethyl (B) 244 (*)    All other components within normal limits  URINALYSIS, ROUTINE W REFLEX MICROSCOPIC - Abnormal; Notable for the following components:   Color, Urine COLORLESS (*)    Specific Gravity, Urine 1.004 (*)    All other components within normal limits  I-STAT CHEM 8, ED - Abnormal; Notable for the following  components:   Potassium 3.2 (*)    Calcium, Ion 0.98 (*)    All other components within normal limits  I-STAT CG4 LACTIC ACID, ED - Abnormal; Notable for the following components:   Lactic Acid, Venous 2.1 (*)    All other components within normal limits  CBC  RAPID URINE DRUG SCREEN, HOSP PERFORMED  PROTIME-INR    EKG None  Radiology DG Chest Port 1 View  Result Date: 03/22/2023 CLINICAL DATA:  Trauma, fall EXAM: PORTABLE CHEST 1 VIEW COMPARISON:  02/13/2020 FINDINGS: Heart and mediastinal contours are within normal limits. No confluent airspace opacities, effusions or pneumothorax.  Posterolateral right 6th rib fracture, new since prior study. IMPRESSION: Right posterolateral 6th rib fracture.  No pneumothorax. Electronically Signed   By: Charlett Nose M.D.   On: 03/22/2023 22:20   DG Pelvis Portable  Result Date: 03/22/2023 CLINICAL DATA:  Trauma EXAM: PORTABLE PELVIS 1-2 VIEWS COMPARISON:  None Available. FINDINGS: There is no evidence of pelvic fracture or diastasis. No pelvic bone lesions are seen. IMPRESSION: Negative. Electronically Signed   By: Helyn Numbers M.D.   On: 03/22/2023 22:20   CT HEAD WO CONTRAST  Result Date: 03/22/2023 CLINICAL DATA:  Fall, head injury EXAM: CT HEAD WITHOUT CONTRAST CT CERVICAL SPINE WITHOUT CONTRAST TECHNIQUE: Multidetector CT imaging of the head and cervical spine was performed following the standard protocol without intravenous contrast. Multiplanar CT image reconstructions of the cervical spine were also generated. RADIATION DOSE REDUCTION: This exam was performed according to the departmental dose-optimization program which includes automated exposure control, adjustment of the mA and/or kV according to patient size and/or use of iterative reconstruction technique. COMPARISON:  CT head dated 03/05/2020 FINDINGS: CT HEAD FINDINGS Brain: No evidence of acute infarction, hemorrhage, hydrocephalus, extra-axial collection or mass lesion/mass effect.  Subcortical white matter and periventricular small vessel ischemic changes. Vascular: No hyperdense vessel or unexpected calcification. Skull: Normal. Negative for fracture or focal lesion. Sinuses/Orbits: The visualized paranasal sinuses are essentially clear. The mastoid air cells are unopacified. Other: Moderate soft tissue swelling/laceration overlying the left frontoparietal region (series 3/image 29). CT CERVICAL SPINE FINDINGS Alignment: Normal cervical lordosis. Skull base and vertebrae: No acute fracture. No primary bone lesion or focal pathologic process. Soft tissues and spinal canal: No prevertebral fluid or swelling. No visible canal hematoma. Disc levels: Mild degenerative changes at C6-7. Spinal canal is patent. Upper chest: Evaluated on dedicated CT chest. Other: None. IMPRESSION: Moderate soft tissue swelling/laceration overlying the left frontoparietal region. No evidence of calvarial fracture. No acute intracranial abnormality. Small vessel ischemic changes. No traumatic injury to the cervical spine. Mild degenerative changes at C6-7. Electronically Signed   By: Charline Bills M.D.   On: 03/22/2023 22:08   CT CERVICAL SPINE WO CONTRAST  Result Date: 03/22/2023 CLINICAL DATA:  Fall, head injury EXAM: CT HEAD WITHOUT CONTRAST CT CERVICAL SPINE WITHOUT CONTRAST TECHNIQUE: Multidetector CT imaging of the head and cervical spine was performed following the standard protocol without intravenous contrast. Multiplanar CT image reconstructions of the cervical spine were also generated. RADIATION DOSE REDUCTION: This exam was performed according to the departmental dose-optimization program which includes automated exposure control, adjustment of the mA and/or kV according to patient size and/or use of iterative reconstruction technique. COMPARISON:  CT head dated 03/05/2020 FINDINGS: CT HEAD FINDINGS Brain: No evidence of acute infarction, hemorrhage, hydrocephalus, extra-axial collection or mass  lesion/mass effect. Subcortical white matter and periventricular small vessel ischemic changes. Vascular: No hyperdense vessel or unexpected calcification. Skull: Normal. Negative for fracture or focal lesion. Sinuses/Orbits: The visualized paranasal sinuses are essentially clear. The mastoid air cells are unopacified. Other: Moderate soft tissue swelling/laceration overlying the left frontoparietal region (series 3/image 29). CT CERVICAL SPINE FINDINGS Alignment: Normal cervical lordosis. Skull base and vertebrae: No acute fracture. No primary bone lesion or focal pathologic process. Soft tissues and spinal canal: No prevertebral fluid or swelling. No visible canal hematoma. Disc levels: Mild degenerative changes at C6-7. Spinal canal is patent. Upper chest: Evaluated on dedicated CT chest. Other: None. IMPRESSION: Moderate soft tissue swelling/laceration overlying the left frontoparietal region. No evidence of calvarial fracture. No acute intracranial  abnormality. Small vessel ischemic changes. No traumatic injury to the cervical spine. Mild degenerative changes at C6-7. Electronically Signed   By: Charline Bills M.D.   On: 03/22/2023 22:08   CT CHEST ABDOMEN PELVIS W CONTRAST  Result Date: 03/22/2023 CLINICAL DATA:  Trauma/fall, back pain EXAM: CT CHEST, ABDOMEN, AND PELVIS WITH CONTRAST TECHNIQUE: Multidetector CT imaging of the chest, abdomen and pelvis was performed following the standard protocol during bolus administration of intravenous contrast. RADIATION DOSE REDUCTION: This exam was performed according to the departmental dose-optimization program which includes automated exposure control, adjustment of the mA and/or kV according to patient size and/or use of iterative reconstruction technique. CONTRAST:  75mL OMNIPAQUE IOHEXOL 350 MG/ML SOLN COMPARISON:  02/13/2020 FINDINGS: CT CHEST FINDINGS Cardiovascular: No evidence of traumatic aortic injury. Mild atherosclerotic calcifications of the aortic  arch. The heart is normal in size.  No pericardial effusion. Severe three-vessel coronary atherosclerosis. Mediastinum/Nodes: No evidence of anterior mediastinal hematoma. No suspicious mediastinal adenopathy. Visualized thyroid is unremarkable. Lungs/Pleura: Mild dependent atelectasis in the bilateral lower lobes. No focal consolidation or aspiration. No suspicious pulmonary nodules. No pleural effusion or pneumothorax. Musculoskeletal: No fracture is seen. Degenerative changes of the thoracic spine. CT ABDOMEN PELVIS FINDINGS Hepatobiliary: Liver is within normal limits. No perihepatic fluid/hemorrhage. Gallbladder is unremarkable. No intrahepatic or extrahepatic ductal dilatation. Pancreas: Within normal limits. Spleen: Within normal limits.  No perisplenic fluid/hemorrhage. Adrenals/Urinary Tract: Adrenal glands are within normal limits. Kidneys are within normal limits.  No hydronephrosis. Bladder is within normal limits. Stomach/Bowel: Stomach is within normal limits. No evidence of bowel obstruction. Appendix is not discretely visualized. No colonic wall thickening or inflammatory changes. Vascular/Lymphatic: No evidence of abdominal aortic aneurysm. Atherosclerotic calcifications of the abdominal aorta and branch vessels, although vessels remain patent. No suspicious abdominopelvic lymphadenopathy. Reproductive: Prostate is unremarkable. Other: No abdominopelvic ascites. Small fat containing bilateral hernias. Musculoskeletal: No fracture is seen. Degenerative changes of the lumbar spine. IMPRESSION: No traumatic injury to the chest, abdomen, or pelvis. Electronically Signed   By: Charline Bills M.D.   On: 03/22/2023 22:07    Procedures Procedures  {Document cardiac monitor, telemetry assessment procedure when appropriate:1}  Medications Ordered in ED Medications  Tdap (BOOSTRIX) injection 0.5 mL (has no administration in time range)  thiamine (VITAMIN B1) injection 100 mg (100 mg Intravenous  Given 03/22/23 2215)  iohexol (OMNIPAQUE) 350 MG/ML injection 75 mL (75 mLs Intravenous Contrast Given 03/22/23 2159)  ondansetron (ZOFRAN) injection 4 mg (4 mg Intravenous Given 03/22/23 2215)  fentaNYL (SUBLIMAZE) injection 50 mcg (50 mcg Intravenous Given 03/22/23 2214)    ED Course/ Medical Decision Making/ A&P Clinical Course as of 03/22/23 2313  Sun Mar 22, 2023  2310 Urinalysis, Routine w reflex microscopic -Urine, Clean Catch(!) [AH]  2310 Urine rapid drug screen (hosp performed) [AH]  2310 Ethanol(!) [AH]  2310 CBC [AH]  2310 Protime-INR [AH]  2310 Comprehensive metabolic panel(!) [AH]  2310 DG Chest Port 1 View [AH]  2310 DG Pelvis Portable I visualized and interpreted portable pelvic and chest x-ray.  He has 6th rib fracture on the right, no other acute findings. [AH]  2311 CT HEAD WO CONTRAST [AH]  2311 CT CERVICAL SPINE WO CONTRAST Visualized interpreted CT head and C-spine, no acute intracranial abnormalities or fractures of the cervical spine [AH]  2311 CT CHEST ABDOMEN PELVIS W CONTRAST Was interpreted CT chest abdomen pelvis, no acute findings, specifically no 6 rib fracture on CT imaging [AH]  2311 Alcohol, Ethyl (B)(!): 244 [name] alcohol  level 244 [AH]    Clinical Course User Index [AH] Arthor Captain, PA-C   {   Click here for ABCD2, HEART and other calculatorsREFRESH Note before signing :1}                              Medical Decision Making Amount and/or Complexity of Data Reviewed Labs: ordered. Radiology: ordered. ECG/medicine tests: ordered.  Risk Prescription drug management.   ***  {Document critical care time when appropriate:1} {Document review of labs and clinical decision tools ie heart score, Chads2Vasc2 etc:1}  {Document your independent review of radiology images, and any outside records:1} {Document your discussion with family members, caretakers, and with consultants:1} {Document social determinants of health affecting pt's  care:1} {Document your decision making why or why not admission, treatments were needed:1} Final Clinical Impression(s) / ED Diagnoses Final diagnoses:  None    Rx / DC Orders ED Discharge Orders     None

## 2023-03-23 MED ORDER — CELECOXIB 200 MG PO CAPS: 200.0000 mg | ORAL_CAPSULE | Freq: Two times a day (BID) | ORAL | 0 refills | Status: DC

## 2023-03-23 NOTE — ED Provider Notes (Signed)
1:17 AM Patient able to ambulate steadily without assistance. VSS. Appropriate for discharge.   Antony Madura, PA-C 03/23/23 0118    Sabas Sous, MD 03/23/23 [phone]

## 2023-03-23 NOTE — ED Notes (Signed)
Ambulated pt down hallway. Pt walked independently with out any trouble.

## 2023-03-23 NOTE — ED Notes (Signed)
Snack and water provided to the pt

## 2023-03-23 NOTE — Discharge Instructions (Addendum)
I have ordered anti-inflammatory medications to add to your chronic pain regimen.  I am unable to order any other narcotic pain medications due to your current prescription. He is read the associated attachments. Do not scratch, rub, or pick at the adhesive. Leave tissue adhesive in place. It will come off naturally after 7-10 days. Do not place tape over the adhesive. The adhesive could come off the wound when you pull the tape off. Protect the wound from further injury until it is healed. Check your wound area every day for signs of infection. Check for: More redness, swelling, or pain,Fluid or blood,Warmth, Pus or a bad smell. Do not take baths, swim, or use a hot tub until your health care provider approves. You may only be allowed to take sponge baths. Ask your health care provider if you may take showers.You can usually shower after the first 24 hours. Cover the dressing with a watertight covering when you take a shower. Do not soak the area where there is tissue adhesive. Do not use any soaps, petroleum jelly products, or ointments on the wound. Certain ointments can weaken the adhesive.

## 2023-04-22 ENCOUNTER — Telehealth: Payer: Self-pay

## 2023-04-22 NOTE — Telephone Encounter (Signed)
Patient needs updated [employer]. Advised patient I need copy of [employer] card. He will email copy to me. aw

## 2023-05-12 ENCOUNTER — Other Ambulatory Visit: Payer: Self-pay | Admitting: Dermatology

## 2023-09-30 ENCOUNTER — Ambulatory Visit: Payer: 59 | Admitting: Dermatology

## 2023-11-09 ENCOUNTER — Ambulatory Visit: Admitting: Dermatology

## 2023-12-22 ENCOUNTER — Encounter: Payer: Self-pay | Admitting: Dermatology

## 2023-12-22 ENCOUNTER — Ambulatory Visit: Admitting: Dermatology

## 2023-12-22 DIAGNOSIS — L578 Other skin changes due to chronic exposure to nonionizing radiation: Secondary | ICD-10-CM | POA: Diagnosis not present

## 2023-12-22 DIAGNOSIS — L409 Psoriasis, unspecified: Secondary | ICD-10-CM

## 2023-12-22 DIAGNOSIS — Z86018 Personal history of other benign neoplasm: Secondary | ICD-10-CM

## 2023-12-22 DIAGNOSIS — Z1283 Encounter for screening for malignant neoplasm of skin: Secondary | ICD-10-CM

## 2023-12-22 DIAGNOSIS — Z79899 Other long term (current) drug therapy: Secondary | ICD-10-CM

## 2023-12-22 DIAGNOSIS — D229 Melanocytic nevi, unspecified: Secondary | ICD-10-CM

## 2023-12-22 DIAGNOSIS — W908XXA Exposure to other nonionizing radiation, initial encounter: Secondary | ICD-10-CM

## 2023-12-22 DIAGNOSIS — D1801 Hemangioma of skin and subcutaneous tissue: Secondary | ICD-10-CM

## 2023-12-22 DIAGNOSIS — L919 Hypertrophic disorder of the skin, unspecified: Secondary | ICD-10-CM

## 2023-12-22 DIAGNOSIS — L814 Other melanin hyperpigmentation: Secondary | ICD-10-CM

## 2023-12-22 DIAGNOSIS — L820 Inflamed seborrheic keratosis: Secondary | ICD-10-CM

## 2023-12-22 DIAGNOSIS — Z7189 Other specified counseling: Secondary | ICD-10-CM

## 2023-12-22 MED ORDER — SKYRIZI PEN 150 MG/ML ~~LOC~~ SOAJ: 150.0000 mg | SUBCUTANEOUS | 2 refills | Status: DC

## 2023-12-22 NOTE — Progress Notes (Signed)
 Follow-Up Visit   Subjective  Juan [name] is a 58 y.o. male who presents for the following: Skin Cancer Screening and Full Body Skin Exam Hx of dsyplastic moles, hx of psoriasis on skyrizi ,  Reports doing well on Skyrizi  injections, no active flares, using vtama  cream as needed for flares. Does reports some joint pain in knees, ankles and toes that he notices in worst in morning.   The patient presents for Total-Body Skin Exam (TBSE) for skin cancer screening and mole check. The patient has spots, moles and lesions to be evaluated, some may be new or changing and the patient may have concern these could be cancer.  The following portions of the chart were reviewed this encounter and updated as appropriate: medications, allergies, medical history  Review of Systems:  No other skin or systemic complaints except as noted in HPI or Assessment and Plan.  Objective  Well appearing patient in no apparent distress; mood and affect are within normal limits.  A full examination was performed including scalp, head, eyes, ears, nose, lips, neck, chest, axillae, abdomen, back, buttocks, bilateral upper extremities, bilateral lower extremities, hands, feet, fingers, toes, fingernails, and toenails. All findings within normal limits unless otherwise noted below.   Relevant physical exam findings are noted in the Assessment and Plan.  back and left calf x 5, right thigh x 1 (6) Erythematous stuck-on, waxy papule or plaque  Assessment & Plan   PSORIASIS on Systemic Treatment x 2 years - Skyrizi  Arms, legs, hips, scalp Exam: A little scaliness of b/l eyebrows  1% BSA. Improved with Skyrizi  injections Chronic and persistent condition with duration or expected duration over one year. Condition is improving with treatment but not currently at goal.  Patient has some joint aches in right knee ankles and toes that is worst in morning  Has skin psoriasis and probable psoriatic arthritis please  evaluate to confirm whether patient has psoriatic arthritis and recommend treatment if indicated   Reviewed labs from May 2025 TSH, CMP, CBC with Diff - WNL Ordered Quantiferon TB   Psoriasis - severe on systemic treatment.  Psoriasis is a chronic non-curable, but treatable genetic/hereditary disease that may have other systemic features affecting other organ systems such as joints (Psoriatic Arthritis).  It is linked with heart disease, inflammatory bowel disease, non-alcoholic fatty liver disease, and depression. Significant skin psoriasis and/or psoriatic arthritis may have significant symptoms and affects activities of daily activity and often benefits from systemic treatments.  These systemic treatments have some potential side effects including immunosuppression and require pre-treatment laboratory screening and periodic laboratory monitoring and periodic in person evaluation and monitoring by the attending dermatologist physician (long term medication management).    Reviewed risks of biologics including immunosuppression, infections, injection site reaction, and failure to improve condition. Goal is control of skin condition, not cure.  Some older biologics such as Humira and Enbrel may slightly increase risk of malignancy and may worsen congestive heart failure.  Taltz and Cosentyx may cause inflammatory bowel disease to flare. The use of biologics requires long term medication management, including periodic office visits and monitoring of blood work.    Treatment Plan: Cont Skyrizi  sq injections q 3 months Cont Vtama  cr qd prn flares Consult Rheumatology - pt with skin Psoriasis and joint aches -stiffer in AM.  R/O Psoriatic Arthritis and discuss treatment options.  Long term medication management.  Patient is using long term (months to years) prescription medication  to control their dermatologic condition.  These  medications require periodic monitoring to evaluate for efficacy and side  effects and may require periodic laboratory monitoring.     SKIN CANCER SCREENING PERFORMED TODAY.  ACTINIC DAMAGE - Chronic condition, secondary to cumulative UV/sun exposure - diffuse scaly erythematous macules with underlying dyspigmentation - Recommend daily broad spectrum sunscreen SPF 30+ to sun-exposed areas, reapply every 2 hours as needed.  - Staying in the shade or wearing long sleeves, sun glasses (UVA+UVB protection) and wide brim hats (4-inch brim around the entire circumference of the hat) are also recommended for sun protection.  - Call for new or changing lesions.  LENTIGINES, SEBORRHEIC KERATOSES, HEMANGIOMAS - Benign normal skin lesions - Benign-appearing - Call for any changes  MELANOCYTIC NEVI - Tan-brown and/or pink-flesh-colored symmetric macules and papules - Benign appearing on exam today - Observation - Call clinic for new or changing moles - Recommend daily use of broad spectrum spf 30+ sunscreen to sun-exposed areas.   HYPOPIGMENTED SCARS ON B/L ARMS  2ndary to past injury / fall Exam: hypopigmented stellate scars of arms. 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  Call if any new or changing lesions are noted between office visits  INFLAMED SEBORRHEIC KERATOSIS (6) back and left calf x 5, right thigh x 1 (6) Symptomatic, irritating, patient would like treated. Destruction of lesion - back and left calf x 5, right thigh x 1 (6) Complexity: simple   Destruction method: cryotherapy   Informed consent: discussed and consent obtained   Timeout:  patient name, date of birth, surgical site, and procedure verified Lesion destroyed  using liquid nitrogen: Yes   Region frozen until ice ball extended beyond lesion: Yes   Outcome: patient tolerated procedure well with no complications   Post-procedure details: wound care instructions given    MEDICATION MANAGEMENT   PSORIASIS   Related Procedures QuantiFERON-TB Gold Plus Ambulatory referral to Rheumatology Related Medications Tapinarof  (VTAMA ) 1 % CREA Apply 1 application  topically daily. risankizumab -rzaa (SKYRIZI  PEN) 150 MG/ML pen Inject 1 mL (150 mg total) into the skin as directed. Every 12 weeks for maintenance. LONG-TERM USE OF HIGH-RISK MEDICATION   Related Procedures QuantiFERON-TB Gold Plus SKIN CANCER SCREENING   ACTINIC SKIN DAMAGE   LENTIGO   MELANOCYTIC NEVUS, UNSPECIFIED LOCATION   COUNSELING AND COORDINATION OF CARE   HISTORY OF DYSPLASTIC NEVUS   Return in about 6 months (around 06/23/2024) for psoriasis follow up, 1 year tbse.  IEleanor Blush, CMA, am acting as scribe for Alm Rhyme, MD.   Documentation: I have reviewed the above documentation for accuracy and completeness, and I agree with the above.  Alm Rhyme, MD

## 2023-12-22 NOTE — Patient Instructions (Signed)

## 2024-01-08 ENCOUNTER — Other Ambulatory Visit (HOSPITAL_COMMUNITY): Payer: Self-pay

## 2024-01-08 ENCOUNTER — Other Ambulatory Visit: Payer: Self-pay

## 2024-01-13 ENCOUNTER — Other Ambulatory Visit: Payer: Self-pay

## 2024-01-13 DIAGNOSIS — L409 Psoriasis, unspecified: Secondary | ICD-10-CM

## 2024-01-13 MED ORDER — SKYRIZI PEN 150 MG/ML ~~LOC~~ SOAJ
150.0000 mg | SUBCUTANEOUS | 2 refills | Status: AC
  Filled 2024-01-15: qty 1, fill #0
  Filled 2024-04-25: qty 1, 84d supply, fill #0

## 2024-01-13 NOTE — Progress Notes (Signed)
 Per insurance - [employer]BS mandating RX fill with [facility]. aw

## 2024-01-13 NOTE — Progress Notes (Signed)
 Pharmacy Patient Advocate Encounter  Insurance verification completed.   The patient is insured through [facility]   Ran test claim for Skyrizi . Co-pay is $3,027. Patient will have copay card information.  This test claim was processed through [facility]- copay amounts may vary at other pharmacies due to pharmacy/plan contracts, or as the patient moves through the different stages of their insurance plan.

## 2024-01-15 ENCOUNTER — Other Ambulatory Visit: Payer: Self-pay

## 2024-01-15 NOTE — Progress Notes (Signed)
 Specialty Pharmacy Initiation Note   Juan [name] is a 58 y.o. male who will be followed by the specialty pharmacy service for RxSp Psoriasis    Review of administration, indication, effectiveness, safety, potential side effects, storage/disposable, and missed dose instructions occurred today for patient's specialty medication(s) Skyrizi     Patient/Caregiver did not have any additional questions or concerns.   [name] therapy is appropriate to: Continue (Patient already well maintained on treatment, transferring pharmacies.)    Goals Addressed             This Visit's Progress    Minimize recurrence of flares       Patient is on track. Patient will maintain adherence. Patient already well maintained on treatment, transferring pharmacies.          Luellen Howson M Mickel Schreur Specialty Pharmacist

## 2024-01-17 NOTE — Progress Notes (Deleted)
 Cardiology Office Note:    Date:  01/17/2024   ID:  Juan [name], DOB 04-03-1966, MRN [id number]  PCP:  Henry Ingle, MD  [facility] Cardiologist:  Rosaly Labarbera [place], MD  [facility] Electrophysiologist:  None  Sleep specialist/CPAP: Dr. Burnard  Referring MD: Henry Ingle, MD   No chief complaint on file.   History of Present Illness:    Juan [name] is a 58 y.o. male with a hx of atrial fibrillation, sleep apnea and hypertension. Last seen in October 2021. Seen to reestablish care.  Patient was diagnosed with atrial fibrillation  by Dr Henry and was referred here.  He was seen on 10/16/2019.  Patient reported heavy Etoh use and  His wife also reported loud snoring and apneic spells.  After discussing various options, he was started on Xarelto  20 mg daily.  Alcohol cessation was discussed with the patient.  Echocardiogram obtained on 10/05/2019 showed EF 55 to 60%, grade 1 DD, normal left atrial size, aortic dilatation measuring at 38 mm.  He underwent a sleep study in June 2021 and was noted to have moderate obstructive sleep apnea, no significant central sleep apnea.  Nocturnal O2 saturation reached nadir of 89% during REM sleep.  CPAP titration was recommended.  On Follow up in August he had self converted back to NSR.   He has been seen by Dr Burnard for management of his sleep apnea. CPAP titration done. Patient states he has the mask and hose but hasn't received the machine yet.   He was admitted in late September 2021 after being found down and intoxicated. He was in NSR. He suffered a small SAH, with hemorrhagic contusions. Also fracture of zygomatic arch/sinus and orbit and retrobulbar hematoma. Anticoagulants were held. Patient states he was drinking heavily then. Has no recollection of anything from midnight until the next day. Since then he reports abstinence from Etoh. Denies any afib that he can tell.   He was admitted 5/20-10/21/23 for Etoh detox.   Past Medical  History:  Diagnosis Date   Alcohol abuse    Anxiety    Atypical nevus 02/06/2015   Right Back - Mild   Atypical nevus 04/30/2016   Right Chest - Mild   Atypical nevus 04/30/2016   Right Shoulder - Mild   Chronic back pain    Drug abuse (HCC)    Hypertension    Lumbar disc disease    Psoriasis 2003   previous treatment: Dermasmooth Oil, Clobetasol , Ultravate, Dovonex, Otezla , Enstilar  Foam, Ketoconazole Shampoo, Talconex, Methotrexate(drinks alcohol)    Past Surgical History:  Procedure Laterality Date   APPENDECTOMY     SHOULDER SURGERY Left     Current Medications: No outpatient medications have been marked as taking for the 01/22/24 encounter (Appointment) with [place], Mystic Labo M, MD.     Allergies:   Patient has no known allergies.   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  Tobacco Use   Smoking status: Former   Smokeless tobacco: Never  Vaping Use   Vaping status: Never Used  Substance and Sexual Activity   Alcohol use: Yes    Alcohol/week: 0.0 standard drinks of alcohol    Comment: 3-6 beers per day   Drug use: Yes   Sexual activity:  Yes  Other Topics Concern   Not on file  Social History Narrative   Not on file   Social Drivers of Health   Financial Resource Strain: Not on file  Food Insecurity: Not on file  Transportation Needs: Not on file  Physical Activity: Not on file  Stress: Not on file  Social Connections: Not on file     Family History: The patient's family history includes Cancer in his mother; Heart attack (age of onset: 9) in his mother; Heart disease in his mother; Hypertension in his sister.  ROS:   Please see the history of present illness.     All other systems reviewed and are negative.  EKGs/Labs/Other Studies Reviewed:     The following studies were reviewed today:  Echo 10/05/2019 1. Left ventricular ejection fraction, by estimation, is 55 to 60%. The  left ventricle has normal function. The left ventricle has no regional  wall motion abnormalities. There is moderate concentric left ventricular  hypertrophy. Left ventricular  diastolic parameters are consistent with Grade I diastolic dysfunction  (impaired relaxation).   2. Right ventricular systolic function is normal. The right ventricular  size is normal.   3. The mitral valve is normal in structure. No evidence of mitral valve  regurgitation. No evidence of mitral stenosis.   4. The aortic valve is tricuspid. Aortic valve regurgitation is trivial.  No aortic stenosis is present.   5. Aortic dilatation noted. There is mild dilatation at the level of the  sinuses of Valsalva measuring 38 mm.   6. The inferior vena cava is normal in size with greater than 50%  respiratory variability, suggesting right atrial pressure of 3 mmHg.   EKG:  EKG is not ordered today.    Recent Labs: 03/22/2023: ALT 21; BUN 8; Creatinine, Ser 0.84; Hemoglobin 13.5; Platelets 210; Potassium 3.2; Sodium 138  Recent Lipid Panel No results found for: CHOL, TRIG, HDL, CHOLHDL, VLDL, LDLCALC, LDLDIRECT  Physical Exam:    VS:  There were no vitals taken for this visit.    Wt Readings from Last 3 Encounters:  03/07/20 159 lb (72.1 kg)  02/14/20 166 lb 10.7 oz (75.6 kg)  01/31/20 165 lb (74.8 kg)     GEN:  Well nourished, well developed in no acute distress HEENT: Normal NECK: No JVD; No carotid bruits LYMPHATICS: No lymphadenopathy CARDIAC: RRR, no murmurs, rubs, gallops RESPIRATORY:  Clear to auscultation without rales, wheezing or rhonchi  ABDOMEN: Soft, non-tender, non-distended MUSCULOSKELETAL:  No edema; No deformity  SKIN: Warm and dry NEUROLOGIC:  Alert and oriented x 3 PSYCHIATRIC:  Normal affect   ASSESSMENT:    No diagnosis  found.  PLAN:    In order of problems listed above:  Paroxysmal  atrial fibrillation: CHA2DS2-Vasc score 1 (HTN), currently on metoprolol . He has self converted.  Given recent events with closed head injury and SAH  I would not recommend resuming Xarelto . 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   Obstructive sleep apnea: Will get started with CPAP therapy. Follow up with Dr Burnard  Hypertension: Blood pressure stable  EtOH abuse: Likely contributing to his atrial fibrillation.  Strongly advised to stop drinking. Currently abstinent.    Medication Adjustments/Labs and Tests Ordered: Current medicines are reviewed at length with the patient today.  Concerns regarding medicines are outlined above.  No orders of the defined types were placed in this encounter.  No orders of the  defined types were placed in this encounter.   There are no Patient Instructions on file for this visit.   Signed, Jnya Brossard [place], MD  01/17/2024 7:57 AM    [facility]

## 2024-01-18 LAB — QUANTIFERON-TB GOLD PLUS
QuantiFERON Mitogen Value: >10 [IU]/mL
QuantiFERON Nil Value: 0.08 [IU]/mL
QuantiFERON TB1 Ag Value: 0.08 [IU]/mL
QuantiFERON TB2 Ag Value: 0.09 [IU]/mL
QuantiFERON-TB Gold Plus: NEGATIVE

## 2024-01-20 ENCOUNTER — Other Ambulatory Visit: Payer: Self-pay

## 2024-01-20 ENCOUNTER — Ambulatory Visit: Payer: Self-pay | Admitting: Dermatology

## 2024-01-20 NOTE — Telephone Encounter (Signed)
 Patient advised of information per Dr. Hester and Melissa. aw

## 2024-01-20 NOTE — Telephone Encounter (Addendum)
 Tried calling patient regarding lab results. No answer. LM for patient to return call.   ----- Message from Alm Rhyme sent at 01/20/2024 12:47 PM EDT ----- Lab = Quantiferon Gold / TB test from 01/14/2024 was: Negative / Normal Pt is on Skyrizi  for Psoriasis  Keep follow up appt ----- Message ----- From: Interface, [facility] Lab Results In Sent: 01/18/2024   7:35 AM EDT To: Alm JAYSON Rhyme, MD

## 2024-01-22 ENCOUNTER — Other Ambulatory Visit: Payer: Self-pay

## 2024-01-22 ENCOUNTER — Ambulatory Visit: Admitting: Cardiology

## 2024-01-29 ENCOUNTER — Other Ambulatory Visit (HOSPITAL_COMMUNITY): Payer: Self-pay

## 2024-02-02 ENCOUNTER — Other Ambulatory Visit: Payer: Self-pay

## 2024-02-03 ENCOUNTER — Other Ambulatory Visit: Payer: Self-pay

## 2024-02-05 ENCOUNTER — Other Ambulatory Visit: Payer: Self-pay

## 2024-02-12 ENCOUNTER — Other Ambulatory Visit: Payer: Self-pay

## 2024-02-17 ENCOUNTER — Other Ambulatory Visit: Payer: Self-pay

## 2024-02-24 ENCOUNTER — Other Ambulatory Visit: Payer: Self-pay

## 2024-03-08 ENCOUNTER — Other Ambulatory Visit: Payer: Self-pay

## 2024-03-18 ENCOUNTER — Other Ambulatory Visit: Payer: Self-pay

## 2024-03-18 NOTE — Progress Notes (Signed)
 Patient has not responded to calls or mychart messages. Dis-enrolling.

## 2024-04-25 ENCOUNTER — Other Ambulatory Visit: Payer: Self-pay

## 2024-04-25 NOTE — Progress Notes (Signed)
 Patient called wanting to confirm information about Skyrizi  copay card. Attempted to reprocess and getting same rejection in regards to maximizer plan. Patient will contact Skyrizi .

## 2024-04-26 ENCOUNTER — Other Ambulatory Visit: Payer: Self-pay

## 2024-05-07 ENCOUNTER — Emergency Department (HOSPITAL_COMMUNITY)

## 2024-05-07 ENCOUNTER — Inpatient Hospital Stay (HOSPITAL_COMMUNITY)
Admission: EM | Admit: 2024-05-07 | Discharge: 2024-05-10 | DRG: 897 | Disposition: A | Discharge status: Home / Self Care | Attending: Family Medicine | Admitting: Family Medicine

## 2024-05-07 ENCOUNTER — Other Ambulatory Visit: Payer: Self-pay

## 2024-05-07 DIAGNOSIS — Z1152 Encounter for screening for COVID-19: Secondary | ICD-10-CM

## 2024-05-07 DIAGNOSIS — M549 Dorsalgia, unspecified: Secondary | ICD-10-CM | POA: Diagnosis present

## 2024-05-07 DIAGNOSIS — F10239 Alcohol dependence with withdrawal, unspecified: Principal | ICD-10-CM | POA: Diagnosis present

## 2024-05-07 DIAGNOSIS — E860 Dehydration: Secondary | ICD-10-CM | POA: Diagnosis present

## 2024-05-07 DIAGNOSIS — S12110A Anterior displaced Type II dens fracture, initial encounter for closed fracture: Secondary | ICD-10-CM | POA: Diagnosis present

## 2024-05-07 DIAGNOSIS — F10939 Alcohol use, unspecified with withdrawal, unspecified: Principal | ICD-10-CM

## 2024-05-07 DIAGNOSIS — F419 Anxiety disorder, unspecified: Secondary | ICD-10-CM | POA: Diagnosis present

## 2024-05-07 DIAGNOSIS — R569 Unspecified convulsions: Principal | ICD-10-CM | POA: Diagnosis present

## 2024-05-07 DIAGNOSIS — Z87891 Personal history of nicotine dependence: Secondary | ICD-10-CM

## 2024-05-07 DIAGNOSIS — Z79899 Other long term (current) drug therapy: Secondary | ICD-10-CM

## 2024-05-07 DIAGNOSIS — F32A Depression, unspecified: Secondary | ICD-10-CM | POA: Diagnosis present

## 2024-05-07 DIAGNOSIS — Y900 Blood alcohol level of less than 20 mg/100 ml: Secondary | ICD-10-CM | POA: Diagnosis present

## 2024-05-07 DIAGNOSIS — G8929 Other chronic pain: Secondary | ICD-10-CM | POA: Diagnosis present

## 2024-05-07 DIAGNOSIS — L409 Psoriasis, unspecified: Secondary | ICD-10-CM | POA: Diagnosis present

## 2024-05-07 DIAGNOSIS — R9431 Abnormal electrocardiogram [ECG] [EKG]: Secondary | ICD-10-CM

## 2024-05-07 DIAGNOSIS — Z8249 Family history of ischemic heart disease and other diseases of the circulatory system: Secondary | ICD-10-CM

## 2024-05-07 DIAGNOSIS — E876 Hypokalemia: Secondary | ICD-10-CM | POA: Diagnosis present

## 2024-05-07 DIAGNOSIS — I480 Paroxysmal atrial fibrillation: Secondary | ICD-10-CM | POA: Diagnosis present

## 2024-05-07 DIAGNOSIS — Z87820 Personal history of traumatic brain injury: Secondary | ICD-10-CM

## 2024-05-07 DIAGNOSIS — I10 Essential (primary) hypertension: Secondary | ICD-10-CM | POA: Diagnosis present

## 2024-05-07 DIAGNOSIS — R7401 Elevation of levels of liver transaminase levels: Secondary | ICD-10-CM | POA: Diagnosis present

## 2024-05-07 LAB — CBC WITH DIFFERENTIAL/PLATELET
Abs Immature Granulocytes: 0.03 K/uL (ref 0.00–0.07)
Basophils Absolute: 0 K/uL (ref 0.0–0.1)
Basophils Relative: 0 %
Eosinophils Absolute: 0 K/uL (ref 0.0–0.5)
Eosinophils Relative: 0 %
HCT: 48.3 % (ref 39.0–52.0)
Hemoglobin: 16.4 g/dL (ref 13.0–17.0)
Immature Granulocytes: 0 %
Lymphocytes Relative: 11 %
Lymphs Abs: 0.8 K/uL (ref 0.7–4.0)
MCH: 27.4 pg (ref 26.0–34.0)
MCHC: 34 g/dL (ref 30.0–36.0)
MCV: 80.6 fL (ref 80.0–100.0)
Monocytes Absolute: 0.7 K/uL (ref 0.1–1.0)
Monocytes Relative: 10 %
Neutro Abs: 5.8 K/uL (ref 1.7–7.7)
Neutrophils Relative %: 79 %
Platelets: 184 K/uL (ref 150–400)
RBC: 5.99 MIL/uL — ABNORMAL HIGH (ref 4.22–5.81)
RDW: 15.7 % — ABNORMAL HIGH (ref 11.5–15.5)
WBC: 7.3 K/uL (ref 4.0–10.5)
nRBC: 0 % (ref 0.0–0.2)

## 2024-05-07 LAB — COMPREHENSIVE METABOLIC PANEL WITH GFR
ALT: 18 U/L (ref 0–44)
AST: 72 U/L — ABNORMAL HIGH (ref 15–41)
Albumin: 4 g/dL (ref 3.5–5.0)
Alkaline Phosphatase: 124 U/L (ref 38–126)
Anion gap: 20 — ABNORMAL HIGH (ref 5–15)
BUN: 6 mg/dL (ref 6–20)
CO2: 22 mmol/L (ref 22–32)
Calcium: 9 mg/dL (ref 8.9–10.3)
Chloride: 93 mmol/L — ABNORMAL LOW (ref 98–111)
Creatinine, Ser: 1.14 mg/dL (ref 0.61–1.24)
GFR, Estimated: >60 mL/min
Glucose, Bld: 128 mg/dL — ABNORMAL HIGH (ref 70–99)
Potassium: 2.9 mmol/L — ABNORMAL LOW (ref 3.5–5.1)
Sodium: 135 mmol/L (ref 135–145)
Total Bilirubin: 1.3 mg/dL — ABNORMAL HIGH (ref 0.0–1.2)
Total Protein: 6.9 g/dL (ref 6.5–8.1)

## 2024-05-07 LAB — URINALYSIS, ROUTINE W REFLEX MICROSCOPIC
Bilirubin Urine: NEGATIVE
Glucose, UA: NEGATIVE mg/dL
Ketones, ur: 15 mg/dL — AB
Leukocytes,Ua: NEGATIVE
Nitrite: NEGATIVE
Protein, ur: 100 mg/dL — AB
Specific Gravity, Urine: 1.01 (ref 1.005–1.030)
pH: 7 (ref 5.0–8.0)

## 2024-05-07 LAB — URINALYSIS, MICROSCOPIC (REFLEX)

## 2024-05-07 LAB — MAGNESIUM: Magnesium: 1.6 mg/dL — ABNORMAL LOW (ref 1.7–2.4)

## 2024-05-07 LAB — CBG MONITORING, ED: Glucose-Capillary: 141 mg/dL — ABNORMAL HIGH (ref 70–99)

## 2024-05-07 LAB — ETHANOL: Alcohol, Ethyl (B): <15 mg/dL

## 2024-05-07 LAB — RESP PANEL BY RT-PCR (RSV, FLU A&B, COVID)  RVPGX2
Influenza A by PCR: NEGATIVE
Influenza B by PCR: NEGATIVE
Resp Syncytial Virus by PCR: NEGATIVE
SARS Coronavirus 2 by RT PCR: NEGATIVE

## 2024-05-07 LAB — PHOSPHORUS: Phosphorus: 1.7 mg/dL — ABNORMAL LOW (ref 2.5–4.6)

## 2024-05-07 MED ORDER — METOPROLOL SUCCINATE ER 25 MG PO TB24
25.0000 mg | ORAL_TABLET | Freq: Every day | ORAL | Status: DC
  Administered 2024-05-08 – 2024-05-10 (×3): 25 mg via ORAL
  Filled 2024-05-07 (×3): qty 1

## 2024-05-07 MED ORDER — LORAZEPAM 1 MG PO TABS
1.0000 mg | ORAL_TABLET | ORAL | Status: DC | PRN
  Administered 2024-05-08: 1 mg via ORAL
  Administered 2024-05-08: 2 mg via ORAL
  Administered 2024-05-08: 1 mg via ORAL
  Administered 2024-05-08: 2 mg via ORAL
  Administered 2024-05-09 – 2024-05-10 (×2): 1 mg via ORAL
  Filled 2024-05-07: qty 1
  Filled 2024-05-07 (×2): qty 2
  Filled 2024-05-07 (×2): qty 1
  Filled 2024-05-07: qty 3

## 2024-05-07 MED ORDER — FOLIC ACID 1 MG PO TABS
1.0000 mg | ORAL_TABLET | Freq: Every day | ORAL | Status: DC
  Administered 2024-05-09 – 2024-05-10 (×2): 1 mg via ORAL
  Filled 2024-05-07 (×4): qty 1

## 2024-05-07 MED ORDER — LORAZEPAM 2 MG/ML IJ SOLN
1.0000 mg | INTRAMUSCULAR | Status: DC | PRN
  Administered 2024-05-08 (×2): 1 mg via INTRAVENOUS
  Administered 2024-05-08 – 2024-05-09 (×2): 2 mg via INTRAVENOUS
  Filled 2024-05-07 (×4): qty 1

## 2024-05-07 MED ORDER — LORAZEPAM 2 MG/ML IJ SOLN
1.0000 mg | Freq: Once | INTRAMUSCULAR | Status: AC
  Administered 2024-05-07: 1 mg via INTRAVENOUS
  Filled 2024-05-07: qty 1

## 2024-05-07 MED ORDER — ADULT MULTIVITAMIN W/MINERALS CH
1.0000 | ORAL_TABLET | Freq: Every day | ORAL | Status: DC
  Administered 2024-05-08 – 2024-05-10 (×3): 1 via ORAL
  Filled 2024-05-07 (×3): qty 1

## 2024-05-07 MED ORDER — SODIUM CHLORIDE 0.9 % IV SOLN: INTRAVENOUS | Status: AC

## 2024-05-07 MED ORDER — POTASSIUM CHLORIDE CRYS ER 20 MEQ PO TBCR
40.0000 meq | EXTENDED_RELEASE_TABLET | Freq: Once | ORAL | Status: AC
  Administered 2024-05-07: 40 meq via ORAL
  Filled 2024-05-07: qty 2

## 2024-05-07 MED ORDER — LORAZEPAM 2 MG/ML IJ SOLN
2.0000 mg | Freq: Once | INTRAMUSCULAR | Status: AC
  Administered 2024-05-07: 2 mg via INTRAVENOUS

## 2024-05-07 MED ORDER — THIAMINE MONONITRATE 100 MG PO TABS
100.0000 mg | ORAL_TABLET | Freq: Every day | ORAL | Status: DC
  Administered 2024-05-08 – 2024-05-10 (×3): 100 mg via ORAL
  Filled 2024-05-07 (×3): qty 1

## 2024-05-07 MED ORDER — MAGNESIUM SULFATE 2 GM/50ML IV SOLN
2.0000 g | Freq: Once | INTRAVENOUS | Status: AC
  Administered 2024-05-07: 2 g via INTRAVENOUS
  Filled 2024-05-07: qty 50

## 2024-05-07 MED ORDER — THIAMINE HCL 100 MG/ML IJ SOLN: 100.0000 mg | Freq: Every day | INTRAMUSCULAR | Status: DC

## 2024-05-07 MED ORDER — LACTATED RINGERS IV BOLUS
1000.0000 mL | Freq: Once | INTRAVENOUS | Status: AC
  Administered 2024-05-07: 1000 mL via INTRAVENOUS

## 2024-05-07 MED ORDER — HYDRALAZINE HCL 20 MG/ML IJ SOLN
5.0000 mg | Freq: Four times a day (QID) | INTRAMUSCULAR | Status: DC | PRN
  Administered 2024-05-08: 5 mg via INTRAVENOUS
  Filled 2024-05-07: qty 1

## 2024-05-07 MED ORDER — AMLODIPINE BESYLATE 5 MG PO TABS
5.0000 mg | ORAL_TABLET | Freq: Every day | ORAL | Status: DC
  Administered 2024-05-08 – 2024-05-10 (×3): 5 mg via ORAL
  Filled 2024-05-07 (×3): qty 1

## 2024-05-07 MED ORDER — POTASSIUM & SODIUM PHOSPHATES 280-160-250 MG PO PACK
1.0000 | PACK | Freq: Once | ORAL | Status: AC
  Administered 2024-05-08: 1 via ORAL
  Filled 2024-05-07: qty 1

## 2024-05-07 NOTE — ED Provider Notes (Signed)
 " [place] EMERGENCY DEPARTMENT AT [facility] Provider Note   CSN: [id number] Arrival date & time: 05/07/24  8146     Patient presents with: Seizures   Juan [name] is a 58 y.o. male.  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  Patient did not recall having a seizure.  EMS was concerned about this but is unclear exactly why and I am unable to reach them to discuss this currently.  Did not move her penis pants.  Does drink alcohol frequently with last drink 1.5 days ago.  Says he only drinks a few seltzers per day.  Looks like based on prior visits he is minimizes alcohol use.  Denies any other drug use to me at this time.  Says he is not on any antiepileptics       Prior to Admission medications  Medication Sig Start Date End Date Taking? Authorizing Provider  acetaminophen  (TYLENOL ) 500 MG tablet Take 2 tablets (1,000 mg total) by mouth every 8 (eight) hours as needed for mild pain. Patient not taking: Reported on 12/22/2023 02/18/20   Dasie Leonor CROME, MD  ALPRAZolam (XANAX) 1 MG tablet Take 1 mg by mouth at bedtime.  Patient not taking: Reported on 12/22/2023 08/30/19   [provider]  amLODipine  (NORVASC ) 5 MG tablet Take 5 mg by mouth daily. 09/14/19   [provider]  Calcipotriene-Betameth Diprop (ENSTILAR ) 0.005-0.064 % FOAM Apply 1 application topically at bedtime. Patient not taking: Reported on 04/02/2022 02/27/20   Livingston Rigg, MD  celecoxib  (CELEBREX ) 200 MG capsule Take 1 capsule (200 mg total) by mouth 2 (two) times daily. 03/23/23   Harris, Abigail, PA-C  clobetasol  (OLUX ) 0.05 % topical foam Apply to affected area qd. 30 day supply Patient not taking:  Reported on 04/02/2022 08/01/21   Sheffield, Kelli R, PA-C  HYDROcodone -acetaminophen  (NORCO) 7.5-325 MG tablet Take 1 tablet by mouth 2 (two) times daily. Patient not taking: Reported on 12/22/2023 02/28/20   [provider]  lisinopril  (PRINIVIL ,ZESTRIL ) 40 MG tablet Take 40 mg by mouth daily. Patient not taking: Reported on 12/22/2023    [provider]  metoprolol  succinate (TOPROL -XL) 25 MG 24 hr tablet Take 25 mg by mouth daily.    [provider]  mupirocin  ointment (BACTROBAN ) 2 % Apply 1 Application topically 3 (three) times daily. Patient not taking: Reported on 12/22/2023 03/12/23   Hester Alm BROCKS, MD  OTEZLA  30 MG TABS TAKE 1 TABLET BY MOUTH TWICE A DAY (NEED APPOINTMENT) Patient not taking: Reported on 04/02/2022 11/27/20   Sheffield, Kelli R, PA-C  polyethylene glycol (MIRALAX  / GLYCOLAX ) 17 g packet Take 17 g by mouth daily as needed for moderate constipation. Patient not taking: Reported on 12/22/2023 02/18/20   Dasie Leonor CROME, MD  risankizumab -rzaa (SKYRIZI  PEN) 150 MG/ML pen Inject 1 mL (150 mg total) into the skin as directed. Every 12 weeks for maintenance. 01/13/24   Hester Alm BROCKS, MD  sildenafil (REVATIO) 20 MG tablet Take 40 mg by mouth daily. 07/31/19   [provider]  sodium chloride  (OCEAN) 0.65 % SOLN nasal spray Place 1 spray into both nostrils in the morning and at bedtime for 7 days. 02/18/20 12/22/23  Dasie Leonor CROME, MD  Tapinarof  (VTAMA ) 1 % CREA Apply 1 application  topically daily. 07/24/22   Hester Alm BROCKS, MD  triamcinolone  cream (KENALOG ) 0.1 % Apply topically daily. Patient not taking: Reported on 12/22/2023 06/19/21   [provider]  triamcinolone  ointment (KENALOG ) 0.1 % Apply 1 application topically 2 (two) times daily as needed (Rash). Patient not taking: Reported on 12/22/2023 02/27/20   Livingston Rigg, MD  valACYclovir  (VALTREX ) 1000 MG tablet TAKE 2 TABLETS AT THE ONSET OF FEVER BLISTERS AND THEN TAKE 2 TABLETS 12  HOURS LATER AS DIRECTED 05/14/23   Hester Alm BROCKS, MD    Allergies: Patient has no known allergies.    Review of Systems  Updated Vital Signs BP (!) 158/103 (BP Location: Left Arm)   Pulse (!) 105   Temp 97.7 F (36.5 C) (Oral)   Resp 18   SpO2 100%   Physical Exam Vitals and nursing note reviewed.  Constitutional:      General: He is not in acute distress.    Appearance: He is well-developed.     Comments: Somewhat anxious  HENT:     Head: Normocephalic and atraumatic.     Right Ear: External ear normal.     Left Ear: External ear normal.     Nose: Nose normal.     Mouth/Throat:     Comments: No tongue bite.  Mild tongue fasciculations Eyes:     Extraocular Movements: Extraocular movements intact.     Conjunctiva/sclera: Conjunctivae normal.     Pupils: Pupils are equal, round, and reactive to light.  Cardiovascular:     Rate and Rhythm: Regular rhythm. Tachycardia present.     Heart sounds: Normal heart sounds.  Pulmonary:     Effort: Pulmonary effort is normal. No respiratory distress.     Breath sounds: Normal breath sounds.  Musculoskeletal:     Cervical back: Normal range of motion and neck supple.     Right lower leg: No edema.     Left lower leg: No edema.  Skin:    General: Skin is warm and dry.  Neurological:     Mental Status: He is alert and oriented to person, place, and time. Mental status is at baseline.     Cranial Nerves: No cranial nerve deficit.     Sensory: No sensory deficit.     Motor: No weakness.     Comments: Mild hand tremor  Psychiatric:        Mood and Affect: Mood normal.        Behavior: Behavior normal.     (all labs ordered are listed, but only abnormal results are displayed) Labs Reviewed - No data to display  EKG: None  Radiology: No results found.  {Document cardiac monitor, telemetry assessment procedure when appropriate:32947} Procedures   Medications Ordered in the ED - No data to display    {Click here  for ABCD2, HEART and other calculators REFRESH Note before signing:1}                              Medical Decision Making Amount and/or Complexity of Data Reviewed Labs: ordered. Radiology: ordered.  Risk Prescription drug management.   ***  {Document critical care time when appropriate  Document review of labs and clinical decision tools ie CHADS2VASC2, etc  Document your independent review of radiology images and any outside records  Document your discussion with family members, caretakers and with consultants  Document social determinants  of health affecting pt's care  Document your decision making why or why not admission, treatments were needed:32947:::1}   Final diagnoses:  None    ED Discharge Orders     None        "

## 2024-05-07 NOTE — ED Triage Notes (Signed)
 Pt BIB [facility] from home for c/o of seizures. 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 No injuries discovered by EMS. hx Childhood seizures and hypertension.   200/100 HR 120 O2 100% CBG 142

## 2024-05-07 NOTE — ED Notes (Signed)
 [place] J c-collar applied per MD orders

## 2024-05-07 NOTE — H&P (Signed)
 " History and Physical    Juan [name] FMW:[id number] DOB: 02-Jul-1965 DOA: 05/07/2024  PCP: Juan Ingle, MD  Chief Complaint: Seizure  HPI: Juan [name] is a 58 y.o. male with medical history significant of hypertension, alcohol use disorder, chronic back pain, opioid use disorder, depression, anxiety, psoriasis, childhood seizures, prior TBI presented to the ED via EMS due to concern for seizure.  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  Vital signs on arrival: Temperature 97.7 F, pulse 105, respiratory rate 18, blood pressure 158/103, and SpO2 100% on room air.  Labs showing no leukocytosis, potassium 2.9, chloride 93, glucose 128, AST 72 and no significant elevation of remainder of LFTs, magnesium  1.6, phosphorus 1.7, ethanol level <15, COVID/influenza/RSV PCR negative, UDS pending, UA not suggestive of infection.  CT head negative for acute intracranial abnormality.  CT C-spine showing age-indeterminate type I odontoid fracture with margins which appear somewhat sclerotic suggesting chronic fracture, however, this appears new compared to prior CT from November 2024.  Patient was placed on CIWA protocol and given doses of IV Ativan  due to concern for alcohol withdrawal seizure.  He was also given oral potassium, 1 L LR, and IV mag 2 g.  ED physician discussed the case with neurosurgeon Dr. Colon who reviewed the patient's CT C-spine and felt that the CT changes represented a broken osteophyte and not an unstable fracture.  Neurosurgeon felt that the patient did not need a c-collar and also felt that there was no need for neurosurgery follow-up.  [facility] called to admit.    Patient is currently awake and alert.  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  He reports history of childhood seizures related to having fevers.  Not on antiepileptics.   Denies recent fevers, cough, shortness of breath, chest pain, nausea, vomiting, or abdominal pain.  He had loose stools yesterday but no further episodes today.  Patient reports history of alcohol abuse for which he reports undergoing detox about 6 months ago but then relapsed a few weeks ago due to feeling depressed as his mother is in hospice.  He has been drinking more heavily and about 2 or 3 days ago he decided to stop drinking as he wanted to improve.  Reports very poor oral intake and is concerned that he might be dehydrated.  Denies suicidal ideation.  Review of Systems:  Review of Systems  All other systems reviewed and are negative.   Past Medical History:  Diagnosis Date   Alcohol abuse    Anxiety    Atypical nevus 02/06/2015   Right Back - Mild   Atypical nevus 04/30/2016   Right Chest - Mild   Atypical nevus 04/30/2016   Right Shoulder - Mild   Chronic back pain    Drug abuse (HCC)    Hypertension    Lumbar disc disease    Psoriasis 2003   previous treatment: Dermasmooth Oil, Clobetasol , Ultravate, Dovonex, Otezla , Enstilar  Foam, Ketoconazole Shampoo, Talconex, Methotrexate(drinks alcohol)    Past Surgical History:  Procedure Laterality Date   APPENDECTOMY     SHOULDER SURGERY Left      reports that he has quit smoking. He has never used smokeless tobacco. He reports current alcohol use. He reports current drug use.  Allergies[1]  Family History  Problem Relation Age of Onset   Cancer Mother  Heart disease Mother    Heart attack Mother 41       MI   Hypertension Sister     Prior to Admission medications  Medication Sig Start Date End Date Taking? Authorizing Provider  ALPRAZolam (XANAX) 1 MG tablet Take 1 mg by mouth daily as needed for anxiety. 08/30/19  Yes [provider]  risankizumab -rzaa (SKYRIZI  PEN) 150 MG/ML pen Inject 1 mL (150 mg total) into the skin as directed. Every 12 weeks for maintenance. 01/13/24  Yes Juan Alm BROCKS, MD   amLODipine  (NORVASC ) 5 MG tablet Take 5 mg by mouth daily. 09/14/19   [provider]  HYDROcodone -acetaminophen  (NORCO) 7.5-325 MG tablet Take 1 tablet by mouth 2 (two) times daily. Patient not taking: Reported on 12/22/2023 02/28/20   [provider]  lisinopril  (PRINIVIL ,ZESTRIL ) 40 MG tablet Take 40 mg by mouth daily. Patient not taking: Reported on 12/22/2023    [provider]  metoprolol  succinate (TOPROL -XL) 25 MG 24 hr tablet Take 25 mg by mouth daily.    [provider]  mupirocin  ointment (BACTROBAN ) 2 % Apply 1 Application topically 3 (three) times daily. Patient not taking: Reported on 12/22/2023 03/12/23   Juan Alm BROCKS, MD  polyethylene glycol (MIRALAX  / GLYCOLAX ) 17 g packet Take 17 g by mouth daily as needed for moderate constipation. Patient not taking: Reported on 12/22/2023 02/18/20   Juan Leonor CROME, MD  sildenafil (REVATIO) 20 MG tablet Take 40 mg by mouth daily. 07/31/19   [provider]  sodium chloride  (OCEAN) 0.65 % SOLN nasal spray Place 1 spray into both nostrils in the morning and at bedtime for 7 days. 02/18/20 12/22/23  Juan Leonor CROME, MD  Tapinarof  (VTAMA ) 1 % CREA Apply 1 application  topically daily. 07/24/22   Juan Alm BROCKS, MD  triamcinolone  cream (KENALOG ) 0.1 % Apply topically daily. Patient not taking: Reported on 12/22/2023 06/19/21   [provider]  triamcinolone  ointment (KENALOG ) 0.1 % Apply 1 application topically 2 (two) times daily as needed (Rash). Patient not taking: Reported on 12/22/2023 02/27/20   Juan Rigg, MD  valACYclovir  (VALTREX ) 1000 MG tablet TAKE 2 TABLETS AT THE ONSET OF FEVER BLISTERS AND THEN TAKE 2 TABLETS 12 HOURS LATER AS DIRECTED 05/14/23   Juan Alm BROCKS, MD    Physical Exam: Vitals:   05/07/24 2140 05/07/24 2145 05/07/24 2203 05/07/24 2245  BP: (!) 162/99 (!) 175/114  (!) 175/116  Pulse: 97 97  89  Resp:  13  (!) 26  Temp:   98 F (36.7 C)   TempSrc:   Oral   SpO2:   95%  97%  Weight:      Height:        Physical Exam Vitals reviewed.  Constitutional:      General: He is not in acute distress.    Comments: Appears slightly anxious and tremulous  HENT:     Head: Normocephalic and atraumatic.  Eyes:     Extraocular Movements: Extraocular movements intact.  Cardiovascular:     Rate and Rhythm: Normal rate and regular rhythm.     Heart sounds: Normal heart sounds.  Pulmonary:     Effort: Pulmonary effort is normal. No respiratory distress.     Breath sounds: Normal breath sounds.  Abdominal:     General: Bowel sounds are normal.     Palpations: Abdomen is soft.     Tenderness: There is no abdominal tenderness. There is no guarding.  Musculoskeletal:  Cervical back: Normal range of motion.     Right lower leg: No edema.     Left lower leg: No edema.  Skin:    General: Skin is warm and dry.  Neurological:     General: No focal deficit present.     Mental Status: He is alert and oriented to person, place, and time.     Labs on Admission: I have personally reviewed following labs and imaging studies  CBC: Recent Labs  Lab 05/07/24 1905  WBC 7.3  NEUTROABS 5.8  HGB 16.4  HCT 48.3  MCV 80.6  PLT 184   Basic Metabolic Panel: Recent Labs  Lab 05/07/24 1905  NA 135  K 2.9*  CL 93*  CO2 22  GLUCOSE 128*  BUN 6  CREATININE 1.14  CALCIUM 9.0  MG 1.6*  PHOS 1.7*   GFR: Estimated Creatinine Clearance: 71.2 mL/min (by C-G formula based on SCr of 1.14 mg/dL). Liver Function Tests: Recent Labs  Lab 05/07/24 1905  AST 72*  ALT 18  ALKPHOS 124  BILITOT 1.3*  PROT 6.9  ALBUMIN 4.0   No results for input(s): LIPASE, AMYLASE in the last 168 hours. No results for input(s): AMMONIA in the last 168 hours. Coagulation Profile: No results for input(s): INR, PROTIME in the last 168 hours. Cardiac Enzymes: No results for input(s): CKTOTAL, CKMB, CKMBINDEX, TROPONINI in the last 168 hours. BNP (last 3  results) No results for input(s): PROBNP in the last 8760 hours. HbA1C: No results for input(s): HGBA1C in the last 72 hours. CBG: Recent Labs  Lab 05/07/24 1905  GLUCAP 141*   Lipid Profile: No results for input(s): CHOL, HDL, LDLCALC, TRIG, CHOLHDL, LDLDIRECT in the last 72 hours. Thyroid Function Tests: No results for input(s): TSH, T4TOTAL, FREET4, T3FREE, THYROIDAB in the last 72 hours. Anemia Panel: No results for input(s): VITAMINB12, FOLATE, FERRITIN, TIBC, IRON, RETICCTPCT in the last 72 hours. Urine analysis:    Component Value Date/Time   COLORURINE YELLOW 05/07/2024 2033   APPEARANCEUR CLEAR 05/07/2024 2033   LABSPEC 1.010 05/07/2024 2033   PHURINE 7.0 05/07/2024 2033   GLUCOSEU NEGATIVE 05/07/2024 2033   HGBUR LARGE (A) 05/07/2024 2033   BILIRUBINUR NEGATIVE 05/07/2024 2033   KETONESUR 15 (A) 05/07/2024 2033   PROTEINUR 100 (A) 05/07/2024 2033   NITRITE NEGATIVE 05/07/2024 2033   LEUKOCYTESUR NEGATIVE 05/07/2024 2033    Radiological Exams on Admission: CT Head Wo Contrast Result Date: 05/07/2024 CLINICAL DATA:  Fall EXAM: CT HEAD WITHOUT CONTRAST CT CERVICAL SPINE WITHOUT CONTRAST TECHNIQUE: Multidetector CT imaging of the head and cervical spine was performed following the standard protocol without intravenous contrast. Multiplanar CT image reconstructions of the cervical spine were also generated. RADIATION DOSE REDUCTION: This exam was performed according to the departmental dose-optimization program which includes automated exposure control, adjustment of the mA and/or kV according to patient size and/or use of iterative reconstruction technique. COMPARISON:  CT brain and cervical spine 03/22/2023 FINDINGS: CT HEAD FINDINGS Brain: No acute territorial infarction, hemorrhage or intracranial mass. Mild atrophy and chronic small vessel ischemic changes of the white matter. Stable ventricle size. Vascular: No hyperdense vessels.   Carotid vascular calcification. Skull: Normal. Negative for fracture or focal lesion. Sinuses/Orbits: Mild mucosal thickening in the sinuses Other: None CT CERVICAL SPINE FINDINGS Alignment: Straightening of the cervical spine. Trace anterolisthesis C5 on C6 and C7 on T1. Facet alignment is within normal limits. Skull base and vertebrae: Vertebral body heights are maintained. New lucency through tip of odontoid  osteophyte, sagittal series 8, image 31. Slightly sclerotic appearing margins. No other abnormal lucencies. Soft tissues and spinal canal: No prevertebral fluid or swelling. No visible canal hematoma. Disc levels: Multilevel degenerative change. Mild disc space narrowing C5-C6. Advanced disc space narrowing C6-C7 and mild disc space narrowing at C7-T1. multilevel facet degenerative changes with foraminal narrowing Upper chest: Lung apices are clear. Other: None IMPRESSION: 1. No CT evidence for acute intracranial abnormality. Atrophy and chronic small vessel ischemic changes of the white matter. 2. Straightening of the cervical spine with multilevel degenerative change. Interval age indeterminate lucency through tip of odontoid, suspect for type 1 odontoid fracture. Margins appear somewhat sclerotic suggesting chronic fracture however this appears new compared with the CT from November 2024. Electronically Signed   By: Luke Bun M.D.   On: 05/07/2024 20:35   CT Cervical Spine Wo Contrast Result Date: 05/07/2024 CLINICAL DATA:  Fall EXAM: CT HEAD WITHOUT CONTRAST CT CERVICAL SPINE WITHOUT CONTRAST TECHNIQUE: Multidetector CT imaging of the head and cervical spine was performed following the standard protocol without intravenous contrast. Multiplanar CT image reconstructions of the cervical spine were also generated. RADIATION DOSE REDUCTION: This exam was performed according to the departmental dose-optimization program which includes automated exposure control, adjustment of the mA and/or kV according  to patient size and/or use of iterative reconstruction technique. COMPARISON:  CT brain and cervical spine 03/22/2023 FINDINGS: CT HEAD FINDINGS Brain: No acute territorial infarction, hemorrhage or intracranial mass. Mild atrophy and chronic small vessel ischemic changes of the white matter. Stable ventricle size. Vascular: No hyperdense vessels.  Carotid vascular calcification. Skull: Normal. Negative for fracture or focal lesion. Sinuses/Orbits: Mild mucosal thickening in the sinuses Other: None CT CERVICAL SPINE FINDINGS Alignment: Straightening of the cervical spine. Trace anterolisthesis C5 on C6 and C7 on T1. Facet alignment is within normal limits. Skull base and vertebrae: Vertebral body heights are maintained. New lucency through tip of odontoid osteophyte, sagittal series 8, image 31. Slightly sclerotic appearing margins. No other abnormal lucencies. Soft tissues and spinal canal: No prevertebral fluid or swelling. No visible canal hematoma. Disc levels: Multilevel degenerative change. Mild disc space narrowing C5-C6. Advanced disc space narrowing C6-C7 and mild disc space narrowing at C7-T1. multilevel facet degenerative changes with foraminal narrowing Upper chest: Lung apices are clear. Other: None IMPRESSION: 1. No CT evidence for acute intracranial abnormality. Atrophy and chronic small vessel ischemic changes of the white matter. 2. Straightening of the cervical spine with multilevel degenerative change. Interval age indeterminate lucency through tip of odontoid, suspect for type 1 odontoid fracture. Margins appear somewhat sclerotic suggesting chronic fracture however this appears new compared with the CT from November 2024. Electronically Signed   By: Luke Bun M.D.   On: 05/07/2024 20:35    EKG: Independently reviewed. Sinus tachycardia, PVCs, LVH, T wave abnormality in lead III, QTc 507.   Assessment and Plan  Seizure likely related to alcohol withdrawal Patient presents due to  concern for seizure at work.  He has history of alcohol use disorder/drinking more heavily for the past few weeks and decided to stop drinking 2 or 3 days ago.  Ethanol level <15.  CT head negative for acute intracranial abnormality.  No further seizure-like activity in the ED.  Appears slightly anxious and tremulous.  Continue CIWA protocol; Ativan  PRN.  Thiamine , folate, and multivitamin.  Given concern for poor p.o. intake, continue maintenance IV fluids..  Seizure precautions.  C-spine abnormality on CT CT C-spine showing age-indeterminate type I odontoid  fracture with margins which appear somewhat sclerotic suggesting chronic fracture, however, this appears new compared to prior CT from November 2024.  Patient denies neck pain.  ED physician discussed the case with neurosurgeon Dr. Colon who reviewed the patient's CT C-spine and felt that the CT changes represented a broken osteophyte and not an unstable fracture.  Neurosurgeon felt that the patient did not need a c-collar and also felt that there was no need for neurosurgery follow-up.   Hypokalemia Hypomagnesemia QT prolongation Keep K >4 and Mag >2.  Avoid QT prolonging drugs.  Hypophosphatemia Replace phosphorus and monitor labs.  Elevated transaminase level Likely due to chronic alcohol use.  Monitor LFTs.  Hypertension SBP currently in the 160s.  Continue amlodipine  and metoprolol .  IV hydralazine  PRN SBP >160.   Depression and anxiety Denies suicidal ideation.  On Ativan  PRN per CIWA protocol as above.  Informed by ED physician that patient's family was concerned about patient abusing Xanax and requested that this medication should not be prescribed to him.  Patient reports taking Zoloft at home for depression but it is not listed in his medications, awaiting pharmacy med rec.  Psoriasis Supposed to be on Skyrizi  and per pharmacy he is currently awaiting insurance preauthorization.  Outpatient follow-up.  DVT prophylaxis:  SCDs Code Status: Full Code (discussed with the patient) Level of care: Progressive Care Unit Admission status: It is my clinical opinion that referral for OBSERVATION is reasonable and necessary in this patient based on the above information provided. The aforementioned taken together are felt to place the patient at high risk for further clinical deterioration. However, it is anticipated that the patient may be medically stable for discharge from the hospital within 24 to 48 hours.  Editha Ram MD [facility]  If 7PM-7AM, please contact night-coverage [email or web address]  05/07/2024, 11:17 PM      [1] No Known Allergies  "

## 2024-05-07 NOTE — ED Notes (Signed)
 C-collar removed by MD

## 2024-05-08 ENCOUNTER — Encounter (HOSPITAL_COMMUNITY): Payer: Self-pay | Admitting: Internal Medicine

## 2024-05-08 LAB — COMPREHENSIVE METABOLIC PANEL WITH GFR
ALT: 21 U/L (ref 0–44)
AST: 93 U/L — ABNORMAL HIGH (ref 15–41)
Albumin: 3.7 g/dL (ref 3.5–5.0)
Alkaline Phosphatase: 107 U/L (ref 38–126)
Anion gap: 10 (ref 5–15)
BUN: 7 mg/dL (ref 6–20)
CO2: 27 mmol/L (ref 22–32)
Calcium: 8.2 mg/dL — ABNORMAL LOW (ref 8.9–10.3)
Chloride: 99 mmol/L (ref 98–111)
Creatinine, Ser: 0.86 mg/dL (ref 0.61–1.24)
GFR, Estimated: >60 mL/min
Glucose, Bld: 90 mg/dL (ref 70–99)
Potassium: 3.3 mmol/L — ABNORMAL LOW (ref 3.5–5.1)
Sodium: 136 mmol/L (ref 135–145)
Total Bilirubin: 1 mg/dL (ref 0.0–1.2)
Total Protein: 6 g/dL — ABNORMAL LOW (ref 6.5–8.1)

## 2024-05-08 LAB — HIV ANTIBODY (ROUTINE TESTING W REFLEX): HIV Screen 4th Generation wRfx: NONREACTIVE

## 2024-05-08 LAB — MAGNESIUM: Magnesium: 2.2 mg/dL (ref 1.7–2.4)

## 2024-05-08 LAB — PHOSPHORUS: Phosphorus: 2.5 mg/dL (ref 2.5–4.6)

## 2024-05-08 MED ORDER — ACETAMINOPHEN 325 MG PO TABS
650.0000 mg | ORAL_TABLET | Freq: Four times a day (QID) | ORAL | Status: DC | PRN
  Administered 2024-05-10: 650 mg via ORAL
  Filled 2024-05-08: qty 2

## 2024-05-08 MED ORDER — POTASSIUM CHLORIDE CRYS ER 20 MEQ PO TBCR
40.0000 meq | EXTENDED_RELEASE_TABLET | Freq: Once | ORAL | Status: AC
  Administered 2024-05-08: 40 meq via ORAL
  Filled 2024-05-08: qty 2

## 2024-05-08 NOTE — Progress Notes (Addendum)
 [facility]  PROGRESS NOTE  RICAHRD SCHWAGER FMW:[id number] DOB: Nov 14, 1965 DOA: 05/07/2024 PCP: Henry Ingle, MD   Brief HPI:    58 y.o. male with medical history significant of hypertension, alcohol use disorder, chronic back pain, opioid use disorder, depression, anxiety, psoriasis, childhood seizures, prior TBI presented to the ED via EMS due to concern for seizure.  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  CT head negative for acute intracranial abnormality. CT C-spine showing age-indeterminate type I odontoid fracture with margins which appear somewhat sclerotic suggesting chronic fracture, however, this appears new compared to prior CT from November 2024. Patient was placed on CIWA protocol and given doses of IV Ativan  due to concern for alcohol withdrawal seizure. He was also given oral potassium, 1 L LR, and IV mag 2 g. ED physician discussed the case with neurosurgeon Dr. Colon who reviewed the patient's CT C-spine and felt that the CT changes represented a broken osteophyte and not an unstable fracture. Neurosurgeon felt that the patient did not need a c-collar and also felt that there was no need for neurosurgery follow-up.    Assessment/Plan:    Seizure likely related to alcohol withdrawal - Presented due to concern for seizure at work -He has history of alcohol use/disorder, stopped drinking alcohol 2 to 3 days ago. -Ethanol level less than 15 -CT head was negative -Started on CIWA protocol, Ativan  as needed -Continue thiamine , folate  C-spine abnormality on CT -CT C-spine showed type I odontoid fracture; somewhat sclerotic, suggesting chronic fracture -No neck pain - ED physician discussed the case with neurosurgeon Dr. Colon who reviewed the patient's CT C-spine and felt that the CT changes represented a broken osteophyte and not an unstable fracture.   -Neurosurgeon felt that the patient did not  need a c-collar and also felt that there was no need for neurosurgery follow-up.    Hypokalemia Hypomagnesemia QT prolongation Keep K >4 and Mag >2.  Avoid QT prolonging drugs. -Follow serum potassium in a.m.   Hypophosphatemia Replete   Elevated transaminase level Likely due to chronic alcohol use.  Monitor LFTs.   Hypertension SBP currently in the 160s.  Continue amlodipine  and metoprolol .  IV hydralazine  PRN SBP >160.    Depression and anxiety Denies suicidal ideation.  On Ativan  PRN per CIWA protocol as above.  Informed by ED physician that patient's family was concerned about patient abusing Xanax and requested that this medication should not be prescribed to him.  Patient reports taking Zoloft at home for depression but it is not listed in his medications   Psoriasis Supposed to be on Skyrizi  and per pharmacy he is currently awaiting insurance preauthorization.  Outpatient follow-up.     DVT prophylaxis:   Medications     amLODipine   5 mg Oral Daily   folic acid   1 mg Oral Daily   metoprolol  succinate  25 mg Oral Daily   multivitamin with minerals  1 tablet Oral Daily   thiamine   100 mg Oral Daily   Or   thiamine   100 mg Intravenous Daily     Data Reviewed:   CBG:  Recent Labs  Lab 05/07/24 1905  GLUCAP 141*    SpO2: 99 %    Vitals:   05/08/24 1215 05/08/24 1230 05/08/24 1242 05/08/24 1245  BP: (!) 174/120 (!) 162/118 (!) 162/118 (!) 150/99  Pulse: 83 87  82  Resp: 16 (!) 21  16  Temp:  TempSrc:      SpO2: 100% 99%  99%  Weight:      Height:          Data Reviewed:  Basic Metabolic Panel: Recent Labs  Lab 05/07/24 1905 05/08/24 0340  NA 135 136  K 2.9* 3.3*  CL 93* 99  CO2 22 27  GLUCOSE 128* 90  BUN 6 7  CREATININE 1.14 0.86  CALCIUM 9.0 8.2*  MG 1.6* 2.2  PHOS 1.7* 2.5    CBC: Recent Labs  Lab 05/07/24 1905  WBC 7.3  NEUTROABS 5.8  HGB 16.4  HCT 48.3  MCV 80.6  PLT 184    LFT Recent Labs  Lab  05/07/24 1905 05/08/24 0340  AST 72* 93*  ALT 18 21  ALKPHOS 124 107  BILITOT 1.3* 1.0  PROT 6.9 6.0*  ALBUMIN 4.0 3.7     Antibiotics: Anti-infectives (From admission, onward)    None        CONSULTS   Code Status: Full code  Family Communication: No family at bedside     Subjective   Denies any complaints.  Denies neck pain   Objective    Physical Examination:  General-appears in no acute distress Heart-S1-S2, regular, no murmur auscultated Lungs-clear to auscultation bilaterally, no wheezing or crackles auscultated Abdomen-soft, nontender, no organomegaly Extremities-no edema in the lower extremities Neuro-alert, oriented x3, no focal deficit noted            Mina Carlisi S Tyresha Fede   [facility] If 7PM-7AM, please contact night-coverage at [email or web address], Office  [phone]   05/08/2024, 1:17 PM  LOS: 0 days

## 2024-05-08 NOTE — ED Notes (Signed)
 Pt had incontinent bowel movement in bed. Pt provided with full linen change and gown change. Pt is agitated but redirectable at this time.

## 2024-05-08 NOTE — ED Notes (Signed)
 Pt had incontinent stool and urine episode in bed. Pts bed linen changed and gown changed.

## 2024-05-08 NOTE — Progress Notes (Incomplete)
" ° °  Brief Progress Note   _____________________________________________________________________________________________________________  Patient Name: Juan [name] Patient DOB: 1966-05-06 Date: @TODAY @      Data: ***    Action: ***    Response:  ***  _____________________________________________________________________________________________________________  [facility] RN Expeditor Diem Pagnotta S Cannie Muckle Please contact us  directly via secure chat (search for [facility]) or by calling us  at [phone] [facility]).  "

## 2024-05-08 NOTE — Progress Notes (Signed)
" ° ° °  EXPEDITER LEVEL LOADING ASSESSMENT NOTE  Patient Name: Juan [name]  DOB:[birth year] Date of Admission: 05/07/2024  Date of Assessment:05/08/2024   -------------------------------------------------------------------------------------------------------------------   Brief clinical summary: 58 y.o. male brought to ED after being found on the ground unresponsive and disoriented   Is there Bed Availability at another [facility] Facility? Yes  If yes, what facility: [facility]  Level of Care Needed:  Yes  MD Agree to transfer: No  Patient agrees to transfer: Yes    -------------------------------------------------------------------------------------------------------------------  [facility] RN Expediter, Nitika Jackowski S Wynetta Seith Please contact us  directly via secure chat (search for [facility]) or by calling us  at [phone] [facility]). "

## 2024-05-08 NOTE — ED Notes (Signed)
 Patient asleep at time of CIWA assessment.

## 2024-05-09 ENCOUNTER — Other Ambulatory Visit (HOSPITAL_COMMUNITY): Payer: Self-pay

## 2024-05-09 ENCOUNTER — Observation Stay (HOSPITAL_COMMUNITY)

## 2024-05-09 DIAGNOSIS — Z87820 Personal history of traumatic brain injury: Secondary | ICD-10-CM | POA: Diagnosis not present

## 2024-05-09 DIAGNOSIS — L409 Psoriasis, unspecified: Secondary | ICD-10-CM | POA: Diagnosis present

## 2024-05-09 DIAGNOSIS — E876 Hypokalemia: Secondary | ICD-10-CM | POA: Diagnosis present

## 2024-05-09 DIAGNOSIS — R569 Unspecified convulsions: Principal | ICD-10-CM

## 2024-05-09 DIAGNOSIS — I10 Essential (primary) hypertension: Secondary | ICD-10-CM | POA: Diagnosis present

## 2024-05-09 DIAGNOSIS — E860 Dehydration: Secondary | ICD-10-CM | POA: Diagnosis present

## 2024-05-09 DIAGNOSIS — G8929 Other chronic pain: Secondary | ICD-10-CM | POA: Diagnosis present

## 2024-05-09 DIAGNOSIS — F10939 Alcohol use, unspecified with withdrawal, unspecified: Secondary | ICD-10-CM | POA: Diagnosis not present

## 2024-05-09 DIAGNOSIS — S12110A Anterior displaced Type II dens fracture, initial encounter for closed fracture: Secondary | ICD-10-CM | POA: Diagnosis present

## 2024-05-09 DIAGNOSIS — F10239 Alcohol dependence with withdrawal, unspecified: Secondary | ICD-10-CM | POA: Diagnosis present

## 2024-05-09 DIAGNOSIS — I480 Paroxysmal atrial fibrillation: Secondary | ICD-10-CM | POA: Diagnosis present

## 2024-05-09 DIAGNOSIS — Y900 Blood alcohol level of less than 20 mg/100 ml: Secondary | ICD-10-CM | POA: Diagnosis present

## 2024-05-09 DIAGNOSIS — Z1152 Encounter for screening for COVID-19: Secondary | ICD-10-CM | POA: Diagnosis not present

## 2024-05-09 DIAGNOSIS — F419 Anxiety disorder, unspecified: Secondary | ICD-10-CM | POA: Diagnosis present

## 2024-05-09 DIAGNOSIS — R7401 Elevation of levels of liver transaminase levels: Secondary | ICD-10-CM | POA: Diagnosis present

## 2024-05-09 DIAGNOSIS — M549 Dorsalgia, unspecified: Secondary | ICD-10-CM | POA: Diagnosis present

## 2024-05-09 DIAGNOSIS — F32A Depression, unspecified: Secondary | ICD-10-CM | POA: Diagnosis present

## 2024-05-09 DIAGNOSIS — Z8249 Family history of ischemic heart disease and other diseases of the circulatory system: Secondary | ICD-10-CM | POA: Diagnosis not present

## 2024-05-09 DIAGNOSIS — Z87891 Personal history of nicotine dependence: Secondary | ICD-10-CM | POA: Diagnosis not present

## 2024-05-09 DIAGNOSIS — Z79899 Other long term (current) drug therapy: Secondary | ICD-10-CM | POA: Diagnosis not present

## 2024-05-09 MED ORDER — LEVETIRACETAM 500 MG PO TABS
500.0000 mg | ORAL_TABLET | Freq: Two times a day (BID) | ORAL | Status: DC
  Administered 2024-05-09 – 2024-05-10 (×2): 500 mg via ORAL
  Filled 2024-05-09 (×2): qty 1

## 2024-05-09 MED ORDER — POTASSIUM CHLORIDE 20 MEQ PO PACK
40.0000 meq | PACK | Freq: Once | ORAL | Status: AC
  Administered 2024-05-09: 40 meq via ORAL
  Filled 2024-05-09: qty 2

## 2024-05-09 NOTE — Plan of Care (Signed)
 Assisted to bathroom. No s/s of withdrawals today. Small tremors. Foods and fluids well tolerated.   Problem: Education: Goal: Knowledge of General Education information will improve Description: Including pain rating scale, medication(s)/side effects and non-pharmacologic comfort measures Outcome: Progressing   Problem: Activity: Goal: Risk for activity intolerance will decrease Outcome: Progressing   Problem: Nutrition: Goal: Adequate nutrition will be maintained Outcome: Progressing   Problem: Safety: Goal: Ability to remain free from injury will improve Outcome: Progressing   Problem: Skin Integrity: Goal: Risk for impaired skin integrity will decrease Outcome: Progressing

## 2024-05-09 NOTE — TOC Initial Note (Signed)
 Transition of Care [facility]) - Initial/Assessment Note    Patient Details  Name: Juan [name] MRN: [id number] Date of Birth: 13-Oct-1965  Transition of Care [facility]) CM/SW Contact:    Andrez JULIANNA George, RN Phone Number: 05/09/2024, 11:33 AM  Clinical Narrative:                  Harvey K Cupp is a 58 y.o. male with medical history significant of hypertension, alcohol use disorder, chronic back pain, opioid use disorder, depression, anxiety, psoriasis, childhood seizures, prior TBI presented to the ED via EMS due to concern for seizure.   Pt is from home alone. Pt states his sister can check on him. Pt manages his own medications at home and does admit to running out at times.  He drives self.   IP Care management following.  Expected Discharge Plan: Home/Self Care Barriers to Discharge: Continued Medical Work up   Patient Goals and CMS Choice            Expected Discharge Plan and Services   Discharge Planning Services: CM Consult   Living arrangements for the past 2 months: Single Family Home                                      Prior Living Arrangements/Services Living arrangements for the past 2 months: Single Family Home Lives with:: Self Patient language and need for interpreter reviewed:: Yes Do you feel safe going back to the place where you live?: Yes        Care giver support system in place?: No (comment) Current home services: DME (says has access to DME but doesnt use any) Criminal Activity/Legal Involvement Pertinent to Current Situation/Hospitalization: No - Comment as needed  Activities of Daily Living   ADL Screening (condition at time of admission) Independently performs ADLs?: Yes (appropriate for developmental age) Is the patient deaf or have difficulty hearing?: No Does the patient have difficulty seeing, even when wearing glasses/contacts?: No Does the patient have difficulty concentrating, remembering, or making decisions?:  No  Permission Sought/Granted                  Emotional Assessment Appearance:: Appears stated age Attitude/Demeanor/Rapport: Engaged Affect (typically observed): Accepting Orientation: : Oriented to Self, Oriented to Place, Oriented to  Time, Oriented to Situation Alcohol / Substance Use: Alcohol Use Psych Involvement: No (comment)  Admission diagnosis:  Seizure (HCC) [R56.9] Alcohol withdrawal syndrome with complication [facility]) [F10.939] Patient Active Problem List   Diagnosis Date Noted   Seizure (HCC) 05/09/2024   Seizure due to alcohol withdrawal (HCC) 05/07/2024   Hypokalemia 05/07/2024   Hypomagnesemia 05/07/2024   Hypophosphatemia 05/07/2024   QT prolongation 05/07/2024   Intracranial hemorrhage (HCC)    Multiple closed facial bone fractures (HCC)    Trauma    Multiple trauma    Traumatic brain injury with loss of consciousness (HCC)    Essential hypertension    DDD (degenerative disc disease), lumbosacral    Polysubstance abuse (HCC)    SAH (subarachnoid hemorrhage) (HCC) 02/13/2020   OSA (obstructive sleep apnea) 01/31/2020   PCP:  Henry Ingle, MD Pharmacy:   [facility] [address] [place] - [address] AT [facility] [address] [place] [address] Phone: [phone] Fax: [phone]  [id number] - [place], [place] - [address] AT [address] [phone]  [address] [place] [address] Phone: [phone] Fax: [phone]     Social Drivers of Health (SDOH) Social History: SDOH Screenings   Food Insecurity: Food Insecurity Present (05/08/2024)  Housing: High Risk (05/08/2024)  Transportation Needs: No Transportation Needs (05/08/2024)  Utilities: Not At Risk (05/08/2024)  Tobacco Use: Medium Risk (05/08/2024)   SDOH Interventions:     Readmission Risk Interventions     No data to display

## 2024-05-09 NOTE — TOC CAGE-AID Note (Signed)
 Transition of Care [facility]) - CAGE-AID Screening   Patient Details  Name: Juan [name] MRN: [id number] Date of Birth: 18-Jan-1966  Transition of Care [facility]) CM/SW Contact:    Andrez JULIANNA George, RN Phone Number: 05/09/2024, 11:32 AM   Clinical Narrative:  Inpatient/ outpatient counseling resources provided.   CAGE-AID Screening:    Have You Ever Felt You Ought to Cut Down on Your Drinking or Drug Use?: Yes Have People Annoyed You By Critizing Your Drinking Or Drug Use?: No Have You Felt Bad Or Guilty About Your Drinking Or Drug Use?: No Have You Ever Had a Drink or Used Drugs First Thing In The Morning to Steady Your Nerves or to Get Rid of a Hangover?: No CAGE-AID Score: 1  Substance Abuse Education Offered: Yes (CM provided him resources)

## 2024-05-09 NOTE — Progress Notes (Signed)
 EEG complete - results pending

## 2024-05-09 NOTE — Progress Notes (Addendum)
 [facility]  PROGRESS NOTE  Juan [name] FMW:[id number] DOB: 27-May-1965 DOA: 05/07/2024 PCP: Juan Ingle, MD   Brief HPI:    58 y.o. male with medical history significant of hypertension, alcohol use disorder, chronic back pain, opioid use disorder, depression, anxiety, psoriasis, childhood seizures, prior TBI presented to the ED via EMS due to concern for seizure.  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  CT head negative for acute intracranial abnormality. CT C-spine showing age-indeterminate type I odontoid fracture with margins which appear somewhat sclerotic suggesting chronic fracture, however, this appears new compared to prior CT from November 2024. Patient was placed on CIWA protocol and given doses of IV Ativan  due to concern for alcohol withdrawal seizure. He was also given oral potassium, 1 L LR, and IV mag 2 g. ED physician discussed the case with neurosurgeon Juan [name] who reviewed the patient's CT C-spine and felt that the CT changes represented a broken osteophyte and not an unstable fracture. Neurosurgeon felt that the patient did not need a c-collar and also felt that there was no need for neurosurgery follow-up.    Assessment/Plan:    Seizure likely related to alcohol withdrawal - Presented due to concern for seizure at work -He has history of alcohol use/disorder, stopped drinking alcohol 2 to 3 days ago. -Ethanol level less than 15 -CT head was negative -Started on CIWA protocol, Ativan  as needed -Continue thiamine , folate - He does have history of childhood seizures and history of TBI.  Will obtain EEG - He might need AEDs, will consult neurology  C-spine abnormality on CT -CT C-spine showed type I odontoid fracture; somewhat sclerotic, suggesting chronic fracture -No neck pain - ED physician discussed the case with neurosurgeon Juan [name] who reviewed the patient's CT C-spine and felt  that the CT changes represented a broken osteophyte and not an unstable fracture.   -Neurosurgeon felt that the patient did not need a c-collar and also felt that there was no need for neurosurgery follow-up.    Hypokalemia Hypomagnesemia QT prolongation Keep K >4 and Mag >2.  Avoid QT prolonging drugs. - Serum potassium is 3.3 -Will replace potassium and follow BMP in am   Hypophosphatemia Replete   Elevated transaminase level Likely due to chronic alcohol use.  Monitor LFTs.   Hypertension SBP currently in the 160s.  Continue amlodipine  and metoprolol .  IV hydralazine  PRN SBP >160.    Depression and anxiety Denies suicidal ideation.  On Ativan  PRN per CIWA protocol as above.  Informed by ED physician that patient's family was concerned about patient abusing Xanax and requested that this medication should not be prescribed to him.  Patient reports taking Zoloft at home for depression but it is not listed in his medications   Psoriasis Supposed to be on Skyrizi  and per pharmacy he is currently awaiting insurance preauthorization.  Outpatient follow-up.  Paroxysmal atrial fibrillation - Briefly went into A-fib, back to normal sinus rhythm - He was seen by cardiology as outpatient for years ago, at that time also he was diagnosed with paroxysmal atrial fibrillation - Not found to be candidate for anticoagulation due to history of subarachnoid hemorrhage with close head injury -CHA2DS2-Vasc score 1 (HTN),  - See note from cardiology, Juan [name] from 03/07/2020     DVT prophylaxis:   Medications     amLODipine   5 mg Oral Daily   folic acid   1 mg Oral Daily   metoprolol   succinate  25 mg Oral Daily   multivitamin with minerals  1 tablet Oral Daily   thiamine   100 mg Oral Daily   Or   thiamine   100 mg Intravenous Daily     Data Reviewed:   CBG:  Recent Labs  Lab 05/07/24 1905  GLUCAP 141*    SpO2: 98 %    Vitals:   05/09/24 0441 05/09/24 0618 05/09/24 0812  05/09/24 1004  BP: (!) 125/94 (!) 128/100 131/78 131/78  Pulse: 80  87   Resp:  18 19   Temp:  97.6 F (36.4 C) 98 F (36.7 C)   TempSrc:  Axillary Oral   SpO2:  98% 98%   Weight:      Height:          Data Reviewed:  Basic Metabolic Panel: Recent Labs  Lab 05/07/24 1905 05/08/24 0340  NA 135 136  K 2.9* 3.3*  CL 93* 99  CO2 22 27  GLUCOSE 128* 90  BUN 6 7  CREATININE 1.14 0.86  CALCIUM 9.0 8.2*  MG 1.6* 2.2  PHOS 1.7* 2.5    CBC: Recent Labs  Lab 05/07/24 1905  WBC 7.3  NEUTROABS 5.8  HGB 16.4  HCT 48.3  MCV 80.6  PLT 184    LFT Recent Labs  Lab 05/07/24 1905 05/08/24 0340  AST 72* 93*  ALT 18 21  ALKPHOS 124 107  BILITOT 1.3* 1.0  PROT 6.9 6.0*  ALBUMIN 4.0 3.7     Antibiotics: Anti-infectives (From admission, onward)    None        CONSULTS   Code Status: Full code  Family Communication: No family at bedside     Subjective   Went briefly into A-fib, now back to normal sinus rhythm.   Objective    Physical Examination:   General-appears in no acute distress Heart-S1-S2, regular, no murmur auscultated Lungs-clear to auscultation bilaterally, no wheezing or crackles auscultated Abdomen-soft, nontender, no organomegaly Extremities-no edema in the lower extremities Neuro-alert, oriented x3, no focal deficit noted           Juan [name]   [facility] If 7PM-7AM, please contact night-coverage at [email or web address], Office  [phone]   05/09/2024, 10:09 AM  LOS: 0 days

## 2024-05-09 NOTE — Procedures (Signed)
 Patient Name: ERNESTO ZUKOWSKI  MRN: [id number]  Epilepsy Attending: Arlin MALVA Krebs  Referring Physician/Provider: Drusilla Sabas RAMAN, MD  Date: 05/09/2024 Duration: 22.19 mins  Patient history: 58yo m with seizure. EEG to evaluate for seizure  Level of alertness: Awake  AEDs during EEG study: None  Technical aspects: This EEG study was done with scalp electrodes positioned according to the 10-20 International system of electrode placement. Electrical activity was reviewed with band pass filter of 1-70Hz , sensitivity of 7 uV/mm, display speed of 47mm/sec with a 60Hz  notched filter applied as appropriate. EEG data were recorded continuously and digitally stored.  Video monitoring was available and reviewed as appropriate.  Description: The posterior dominant rhythm consists of 9-10 Hz activity of moderate voltage (25-35 uV) seen predominantly in posterior head regions, symmetric and reactive to eye opening and eye closing. Physiologic photic driving was not seen during photic stimulation.  Hyperventilation was not performed.     IMPRESSION: This study is within normal limits. No seizures or epileptiform discharges were seen throughout the recording.  A normal interictal EEG does not exclude the diagnosis of epilepsy.   Deirdre Gryder O Bethann Qualley

## 2024-05-09 NOTE — Plan of Care (Signed)
  Problem: Pain Managment: Goal: General experience of comfort will improve and/or be controlled Outcome: Progressing   Problem: Safety: Goal: Ability to remain free from injury will improve Outcome: Progressing

## 2024-05-09 NOTE — Consult Note (Signed)
 Neurology Consultation Reason for Consult: seizure Referring Physician: Dr Sabas Brod  CC: seizure  History is obtained from: patient, chart review  HPI: Juan [name] is a 58 y.o. male  with PMH of multiple TBI, seizures in past, alcohol use who was brought in on 05/07/2024 due to an episode of unresponsiveness. Patient states he was at work and was feeling a little dehydrated and next thing he knows he is at the hospital. There is no h/o jerking, tongue bite or incontinence. Patient does report drinking few beers daily and increased stress at home. States he was on phenobarb for seizure many years ago but was discontinued as he remained sz free.   ROS: All other systems reviewed and negative except as noted in the HPI.   Past Medical History:  Diagnosis Date   Alcohol abuse    Anxiety    Atypical nevus 02/06/2015   Right Back - Mild   Atypical nevus 04/30/2016   Right Chest - Mild   Atypical nevus 04/30/2016   Right Shoulder - Mild   Chronic back pain    Drug abuse (HCC)    Hypertension    Lumbar disc disease    Psoriasis 2003   previous treatment: Dermasmooth Oil, Clobetasol , Ultravate, Dovonex, Otezla , Enstilar  Foam, Ketoconazole Shampoo, Talconex, Methotrexate(drinks alcohol)    Family History  Problem Relation Age of Onset   Cancer Mother    Heart disease Mother    Heart attack Mother 74       MI   Hypertension Sister     Social History:  reports that he has quit smoking. He has never used smokeless tobacco. He reports current alcohol use. He reports current drug use.  Medications Prior to Admission  Medication Sig Dispense Refill Last Dose/Taking   ALPRAZolam (XANAX) 1 MG tablet Take 1 mg by mouth daily as needed for anxiety.   05/04/2024   amLODipine  (NORVASC ) 5 MG tablet Take 5 mg by mouth daily.   Past Month   lisinopril  (PRINIVIL ,ZESTRIL ) 40 MG tablet Take 40 mg by mouth daily.   05/07/2024 Morning   LORazepam  (ATIVAN ) 0.5 MG tablet Take 0.5 mg by mouth 3  (three) times a week. For withdrawal symptoms   Past Week   meloxicam  (MOBIC ) 7.5 MG tablet Take 7.5 mg by mouth daily as needed for pain.   Past Week   metoprolol  succinate (TOPROL -XL) 25 MG 24 hr tablet Take 25 mg by mouth daily.   Past Week   sertraline (ZOLOFT) 100 MG tablet Take 100 mg by mouth daily.   05/07/2024 Morning   traZODone (DESYREL) 50 MG tablet Take 50 mg by mouth at bedtime.   05/06/2024   risankizumab -rzaa (SKYRIZI  PEN) 150 MG/ML pen Inject 1 mL (150 mg total) into the skin as directed. Every 12 weeks for maintenance. (Patient not taking: Reported on 05/08/2024) 1 mL 2 Not Taking      Exam: Current vital signs: BP (!) 140/88 (BP Location: Left Arm)   Pulse 82   Temp 98.6 F (37 C)   Resp 17   Ht 5' 10 (1.778 m)   Wt 77 kg   SpO2 95%   BMI 24.36 kg/m  Vital signs in last 24 hours: Temp:  [97.5 F (36.4 C)-99.3 F (37.4 C)] 98.6 F (37 C) (12/22 1529) Pulse Rate:  [80-134] 82 (12/22 1529) Resp:  [17-21] 17 (12/22 1529) BP: (108-157)/(72-115) 140/88 (12/22 1529) SpO2:  [95 %-100 %] 95 % (12/22 1529) Weight:  [77 kg] 77 kg (  12/21 1800)   Physical Exam  Constitutional: Appears well-developed and well-nourished.  Psych: Affect appropriate to situation Neuro: AOX3, CN grossly intact, 5/5 in all extremities, sensory intact to LT, FTN intact  I have reviewed labs in epic and the results pertinent to this consultation are: CBC:  Recent Labs  Lab 05/07/24 1905  WBC 7.3  NEUTROABS 5.8  HGB 16.4  HCT 48.3  MCV 80.6  PLT 184    Basic Metabolic Panel:  Lab Results  Component Value Date   NA 136 05/08/2024   K 3.3 (L) 05/08/2024   CO2 27 05/08/2024   GLUCOSE 90 05/08/2024   BUN 7 05/08/2024   CREATININE 0.86 05/08/2024   CALCIUM 8.2 (L) 05/08/2024   GFRNONAA >60 05/08/2024   GFRAA >60 02/17/2020   Lipid Panel: No results found for: LDLCALC HgbA1c: No results found for: HGBA1C Urine Drug Screen:     Component Value Date/Time   LABOPIA NONE  DETECTED 03/22/2023 2222   COCAINSCRNUR NONE DETECTED 03/22/2023 2222   LABBENZ POSITIVE (A) 03/22/2023 2222   AMPHETMU NONE DETECTED 03/22/2023 2222   THCU NONE DETECTED 03/22/2023 2222   LABBARB NONE DETECTED 03/22/2023 2222    Alcohol Level     Component Value Date/Time   [facility] <15 05/07/2024 1906     I have reviewed the images obtained:  CTH wo contrast 05/07/2024:  No CT evidence for acute intracranial abnormality. Atrophy and chronic small vessel ischemic changes of the white matter.  Straightening of the cervical spine with multilevel degenerative change. Interval age indeterminate lucency through tip of odontoid, suspect for type 1 odontoid fracture. Margins appear somewhat sclerotic suggesting chronic fracture however this appears new compared with the CT from November 2024.  MR brain wo contrast 10/15/2023 :Brain: No acute infarct, mass effect or extra-axial collection.  Chronic blood products at the left frontal lobe. There is multifocal  hyperintense T2-weighted signal within the white matter. Generalized volume loss. The midline structures are normal.  Vascular: Normal flow voids.  Skull and upper cervical spine: Normal calvarium and skull base.  Visualized upper cervical spine and soft tissues are normal.  Sinuses/Orbits:No paranasal sinus fluid levels or advanced mucosal  thickening. No mastoid or middle ear effusion. Normal orbits.   IMPRESSION:  1. No acute intracranial abnormality.  2. Findings of chronic small vessel ischemia and volume loss.   ASSESSMENT/PLAN: 58yo M with an episode of unresponsiveness. Unclear if this was due to seizure or note. However, he has h/o TBI and prior seizures, alcohol use and MRI brain shows chronic blood products at the left frontal lobe which increases his risk of seizure recurrence   Seizure - recommend starting keppra  500mg  BID - EEG pending - seizure precautions discussed including NO driving - alcohol cessation counselling -  f/u with neuro in 2-3 months ( order places) - Discussed plan with Dr drusilla via secure chat  Thank you for allowing us  to participate in the care of this patient. If you have any further questions, please contact  me or neurohospitalist.     I personally spent a total of 82 minutes in the care of the patient today including getting/reviewing separately obtained history, performing a medically appropriate exam/evaluation, counseling and educating, placing orders, referring and communicating with other health care professionals, documenting clinical information in the EHR, independently interpreting results, and coordinating care.         Arlin Krebs Epilepsy [facility]

## 2024-05-09 NOTE — Progress Notes (Signed)
" °   05/09/24 9380  Provider Notification  Provider Name/Title Dr. Franky  Date Provider Notified 05/09/24  Time Provider Notified [phone]  Method of Notification  (secured chat)  Notification Reason Critical Result (afib between 0609 till 0617)  Provider response See new orders (ekg ordered)  Date of Provider Response 05/09/24  Time of Provider Response [phone]   Patient shows no signs of acute distress, denies any chest pain or sob "

## 2024-05-10 DIAGNOSIS — F10939 Alcohol use, unspecified with withdrawal, unspecified: Secondary | ICD-10-CM | POA: Diagnosis not present

## 2024-05-10 DIAGNOSIS — E876 Hypokalemia: Secondary | ICD-10-CM | POA: Diagnosis not present

## 2024-05-10 DIAGNOSIS — R569 Unspecified convulsions: Secondary | ICD-10-CM | POA: Diagnosis not present

## 2024-05-10 MED ORDER — VITAMIN B-1 100 MG PO TABS: 100.0000 mg | ORAL_TABLET | Freq: Every day | ORAL | 0 refills | Status: AC

## 2024-05-10 MED ORDER — METOPROLOL SUCCINATE ER 25 MG PO TB24: 25.0000 mg | ORAL_TABLET | Freq: Every day | ORAL | 2 refills | Status: AC

## 2024-05-10 MED ORDER — AMLODIPINE BESYLATE 5 MG PO TABS: 5.0000 mg | ORAL_TABLET | Freq: Every day | ORAL | 2 refills | Status: AC

## 2024-05-10 MED ORDER — LEVETIRACETAM 500 MG PO TABS: 500.0000 mg | ORAL_TABLET | Freq: Two times a day (BID) | ORAL | 3 refills | Status: AC

## 2024-05-10 MED ORDER — FOLIC ACID 1 MG PO TABS: 1.0000 mg | ORAL_TABLET | Freq: Every day | ORAL | 0 refills | Status: AC

## 2024-05-10 NOTE — Plan of Care (Signed)

## 2024-05-10 NOTE — Discharge Summary (Signed)
 " Physician Discharge Summary   Patient: Juan [name] MRN: [id number] DOB: 12-18-1965  Admit date:     05/07/2024  Discharge date: 05/10/2024  Discharge Physician: Sabas GORMAN Brod   PCP: Henry Ingle, MD   Recommendations at discharge:   Follow-up with PCP in 1 week No driving for 6 months due to recent seizure until cleared by neurology or PCP   Seizure precautions: Per [place]  DMV statutes, patients with seizures are not allowed to drive until they have been seizure-free for six months and cleared by a physician    Use caution when using heavy equipment or power tools. Avoid working on ladders or at heights. Take showers instead of baths. Ensure the water temperature is not too high on the home water heater. Do not go swimming alone. Do not lock yourself in a room alone (i.e. bathroom). When caring for infants or small children, sit down when holding, feeding, or changing them to minimize risk of injury to the child in the event you have a seizure. Maintain good sleep hygiene. Avoid alcohol.   Discharge Diagnoses: Principal Problem:   Seizure due to alcohol withdrawal (HCC) Active Problems:   Hypokalemia   Hypomagnesemia   Hypophosphatemia   QT prolongation   Seizure (HCC)  Resolved Problems:   * No resolved hospital problems. *  Hospital Course:  58 y.o. male with medical history significant of hypertension, alcohol use disorder, chronic back pain, opioid use disorder, depression, anxiety, psoriasis, childhood seizures, prior TBI presented to the ED via EMS due to concern for seizure.  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  CT head negative for acute intracranial abnormality. CT C-spine showing age-indeterminate type I odontoid fracture with margins which appear somewhat sclerotic suggesting chronic fracture, however, this appears new compared to prior CT from November 2024. Patient was placed on CIWA  protocol and given doses of IV Ativan  due to concern for alcohol withdrawal seizure. He was also given oral potassium, 1 L LR, and IV mag 2 g. ED physician discussed the case with neurosurgeon Dr. Colon who reviewed the patient's CT C-spine and felt that the CT changes represented a broken osteophyte and not an unstable fracture. Neurosurgeon felt that the patient did not need a c-collar and also felt that there was no need for neurosurgery follow-up.   Assessment and Plan:  Seizure likely related to alcohol withdrawal - Presented due to concern for seizure at work -He has history of alcohol use/disorder, stopped drinking alcohol 2 to 3 days ago. -Ethanol level less than 15 -CT head was negative -Started on CIWA protocol, Ativan  as needed -Continue thiamine , folate - He does have history of childhood seizures and history of TBI.  EEG obtained, did not show epileptiform discharges - Patient seen by neurology, started on Keppra  500 mg p.o. twice daily -No driving for 6 months unless seizure-free and cleared by PCP or neurology   C-spine abnormality on CT -CT C-spine showed type I odontoid fracture; somewhat sclerotic, suggesting chronic fracture -No neck pain - ED physician discussed the case with neurosurgeon Dr. Colon who reviewed the patient's CT C-spine and felt that the CT changes represented a broken osteophyte and not an unstable fracture.   -Neurosurgeon felt that the patient did not need a c-collar and also felt that there was no need for neurosurgery follow-up.    Hypokalemia Hypomagnesemia QT prolongation Keep K >4 and Mag >2.  Avoid QT prolonging drugs. -  Serum potassium is 3.3 - Replace potassium with 40 mEq p.o. x 1   Hypophosphatemia Replete   Elevated transaminase level Likely due to chronic alcohol use.  Monitor LFTs.   Hypertension SBP currently in the 160s.  Continue amlodipine  and metoprolol .  I Blood pressure is significantly improved Discontinue lisinopril   and continue with amlodipine  and metoprolol    Depression and anxiety Denies suicidal ideation.  On Ativan  PRN per CIWA protocol as above.  Informed by ED physician that patient's family was concerned about patient abusing Xanax and requested that this medication should not be prescribed to him.   -   Psoriasis Supposed to be on Skyrizi  and per pharmacy he is currently awaiting insurance preauthorization.  Outpatient follow-up.   Paroxysmal atrial fibrillation - Briefly went into A-fib, back to normal sinus rhythm - He was seen by cardiology as outpatient for years ago, at that time also he was diagnosed with paroxysmal atrial fibrillation - Not found to be candidate for anticoagulation due to history of subarachnoid hemorrhage with close head injury -CHA2DS2-Vasc score 1 (HTN),  - See note from cardiology, Dr. Jordan from 03/07/2020        Consultants: Neurology Procedures performed: EEG Disposition: Home Diet recommendation:  Regular diet DISCHARGE MEDICATION: Allergies as of 05/10/2024   No Known Allergies      Medication List     STOP taking these medications    ALPRAZolam 1 MG tablet Commonly known as: XANAX   lisinopril  40 MG tablet Commonly known as: ZESTRIL    LORazepam  0.5 MG tablet Commonly known as: ATIVAN    meloxicam  7.5 MG tablet Commonly known as: MOBIC        TAKE these medications    amLODipine  5 MG tablet Commonly known as: NORVASC  Take 1 tablet (5 mg total) by mouth daily.   folic acid  1 MG tablet Commonly known as: FOLVITE  Take 1 tablet (1 mg total) by mouth daily.   levETIRAcetam  500 MG tablet Commonly known as: KEPPRA  Take 1 tablet (500 mg total) by mouth 2 (two) times daily.   metoprolol  succinate 25 MG 24 hr tablet Commonly known as: TOPROL -XL Take 1 tablet (25 mg total) by mouth daily.   sertraline 100 MG tablet Commonly known as: ZOLOFT Take 100 mg by mouth daily.   Skyrizi  Pen 150 MG/ML pen Generic drug:  risankizumab -rzaa Inject 1 mL (150 mg total) into the skin as directed. Every 12 weeks for maintenance.   thiamine  100 MG tablet Commonly known as: Vitamin B-1 Take 1 tablet (100 mg total) by mouth daily.   traZODone 50 MG tablet Commonly known as: DESYREL Take 50 mg by mouth at bedtime.        Discharge Exam: Fredricka Weights   05/07/24 1910 05/08/24 1800  Weight: 79.4 kg 77 kg   General-appears in no acute distress Heart-S1-S2, regular, no murmur auscultated Lungs-clear to auscultation bilaterally, no wheezing or crackles auscultated Abdomen-soft, nontender, no organomegaly Extremities-no edema in the lower extremities Neuro-alert, oriented x3, no focal deficit noted  Condition at discharge: good  The results of significant diagnostics from this hospitalization (including imaging, microbiology, ancillary and laboratory) are listed below for reference.   Imaging Studies: EEG adult Result Date: 05/09/2024 Shelton Arlin KIDD, MD     05/09/2024  5:42 PM Patient Name: DEVEAN SKOCZYLAS MRN: [id number] Epilepsy Attending: Arlin KIDD Shelton Referring Physician/Provider: Drusilla Sabas RAMAN, MD Date: 05/09/2024 Duration: 22.19 mins Patient history: 58yo m with seizure. EEG to evaluate for seizure Level of alertness: Awake AEDs during  EEG study: None Technical aspects: This EEG study was done with scalp electrodes positioned according to the 10-20 International system of electrode placement. Electrical activity was reviewed with band pass filter of 1-70Hz , sensitivity of 7 uV/mm, display speed of 74mm/sec with a 60Hz  notched filter applied as appropriate. EEG data were recorded continuously and digitally stored.  Video monitoring was available and reviewed as appropriate. Description: The posterior dominant rhythm consists of 9-10 Hz activity of moderate voltage (25-35 uV) seen predominantly in posterior head regions, symmetric and reactive to eye opening and eye closing. Physiologic photic driving was  not seen during photic stimulation.  Hyperventilation was not performed.   IMPRESSION: This study is within normal limits. No seizures or epileptiform discharges were seen throughout the recording. A normal interictal EEG does not exclude the diagnosis of epilepsy. Priyanka O Yadav   CT Head Wo Contrast Result Date: 05/07/2024 CLINICAL DATA:  Fall EXAM: CT HEAD WITHOUT CONTRAST CT CERVICAL SPINE WITHOUT CONTRAST TECHNIQUE: Multidetector CT imaging of the head and cervical spine was performed following the standard protocol without intravenous contrast. Multiplanar CT image reconstructions of the cervical spine were also generated. RADIATION DOSE REDUCTION: This exam was performed according to the departmental dose-optimization program which includes automated exposure control, adjustment of the mA and/or kV according to patient size and/or use of iterative reconstruction technique. COMPARISON:  CT brain and cervical spine 03/22/2023 FINDINGS: CT HEAD FINDINGS Brain: No acute territorial infarction, hemorrhage or intracranial mass. Mild atrophy and chronic small vessel ischemic changes of the white matter. Stable ventricle size. Vascular: No hyperdense vessels.  Carotid vascular calcification. Skull: Normal. Negative for fracture or focal lesion. Sinuses/Orbits: Mild mucosal thickening in the sinuses Other: None CT CERVICAL SPINE FINDINGS Alignment: Straightening of the cervical spine. Trace anterolisthesis C5 on C6 and C7 on T1. Facet alignment is within normal limits. Skull base and vertebrae: Vertebral body heights are maintained. New lucency through tip of odontoid osteophyte, sagittal series 8, image 31. Slightly sclerotic appearing margins. No other abnormal lucencies. Soft tissues and spinal canal: No prevertebral fluid or swelling. No visible canal hematoma. Disc levels: Multilevel degenerative change. Mild disc space narrowing C5-C6. Advanced disc space narrowing C6-C7 and mild disc space narrowing at  C7-T1. multilevel facet degenerative changes with foraminal narrowing Upper chest: Lung apices are clear. Other: None IMPRESSION: 1. No CT evidence for acute intracranial abnormality. Atrophy and chronic small vessel ischemic changes of the white matter. 2. Straightening of the cervical spine with multilevel degenerative change. Interval age indeterminate lucency through tip of odontoid, suspect for type 1 odontoid fracture. Margins appear somewhat sclerotic suggesting chronic fracture however this appears new compared with the CT from November 2024. Electronically Signed   By: Luke Bun M.D.   On: 05/07/2024 20:35   CT Cervical Spine Wo Contrast Result Date: 05/07/2024 CLINICAL DATA:  Fall EXAM: CT HEAD WITHOUT CONTRAST CT CERVICAL SPINE WITHOUT CONTRAST TECHNIQUE: Multidetector CT imaging of the head and cervical spine was performed following the standard protocol without intravenous contrast. Multiplanar CT image reconstructions of the cervical spine were also generated. RADIATION DOSE REDUCTION: This exam was performed according to the departmental dose-optimization program which includes automated exposure control, adjustment of the mA and/or kV according to patient size and/or use of iterative reconstruction technique. COMPARISON:  CT brain and cervical spine 03/22/2023 FINDINGS: CT HEAD FINDINGS Brain: No acute territorial infarction, hemorrhage or intracranial mass. Mild atrophy and chronic small vessel ischemic changes of the white matter. Stable ventricle size. Vascular: No  hyperdense vessels.  Carotid vascular calcification. Skull: Normal. Negative for fracture or focal lesion. Sinuses/Orbits: Mild mucosal thickening in the sinuses Other: None CT CERVICAL SPINE FINDINGS Alignment: Straightening of the cervical spine. Trace anterolisthesis C5 on C6 and C7 on T1. Facet alignment is within normal limits. Skull base and vertebrae: Vertebral body heights are maintained. New lucency through tip of  odontoid osteophyte, sagittal series 8, image 31. Slightly sclerotic appearing margins. No other abnormal lucencies. Soft tissues and spinal canal: No prevertebral fluid or swelling. No visible canal hematoma. Disc levels: Multilevel degenerative change. Mild disc space narrowing C5-C6. Advanced disc space narrowing C6-C7 and mild disc space narrowing at C7-T1. multilevel facet degenerative changes with foraminal narrowing Upper chest: Lung apices are clear. Other: None IMPRESSION: 1. No CT evidence for acute intracranial abnormality. Atrophy and chronic small vessel ischemic changes of the white matter. 2. Straightening of the cervical spine with multilevel degenerative change. Interval age indeterminate lucency through tip of odontoid, suspect for type 1 odontoid fracture. Margins appear somewhat sclerotic suggesting chronic fracture however this appears new compared with the CT from November 2024. Electronically Signed   By: Luke Bun M.D.   On: 05/07/2024 20:35    Microbiology: Results for orders placed or performed during the hospital encounter of 05/07/24  Resp panel by RT-PCR (RSV, Flu A&B, Covid) Anterior Nasal Swab     Status: None   Collection Time: 05/07/24  7:16 PM   Specimen: Anterior Nasal Swab  Result Value Ref Range Status   SARS Coronavirus 2 by RT PCR NEGATIVE NEGATIVE Final   Influenza A by PCR NEGATIVE NEGATIVE Final   Influenza B by PCR NEGATIVE NEGATIVE Final    Comment: (NOTE) The Xpert Xpress SARS-CoV-2/FLU/RSV plus assay is intended as an aid in the diagnosis of influenza from Nasopharyngeal swab specimens and should not be used as a sole basis for treatment. Nasal washings and aspirates are unacceptable for Xpert Xpress SARS-CoV-2/FLU/RSV testing.  Fact Sheet for Patients: bloggercourse.com  Fact Sheet for Healthcare Providers: seriousbroker.it  This test is not yet approved or cleared by the [place]  FDA  and has been authorized for detection and/or diagnosis of SARS-CoV-2 by FDA under an Emergency Use Authorization (EUA). This EUA will remain in effect (meaning this test can be used) for the duration of the COVID-19 declaration under Section 564(b)(1) of the Act, 21 U.S.C. section 360bbb-3(b)(1), unless the authorization is terminated or revoked.     Resp Syncytial Virus by PCR NEGATIVE NEGATIVE Final    Comment: (NOTE) Fact Sheet for Patients: bloggercourse.com  Fact Sheet for Healthcare Providers: seriousbroker.it  This test is not yet approved or cleared by the [place]  FDA and has been authorized for detection and/or diagnosis of SARS-CoV-2 by FDA under an Emergency Use Authorization (EUA). This EUA will remain in effect (meaning this test can be used) for the duration of the COVID-19 declaration under Section 564(b)(1) of the Act, 21 U.S.C. section 360bbb-3(b)(1), unless the authorization is terminated or revoked.  Performed at [facility], [address] [place], [place] [address]     Labs: CBC: Recent Labs  Lab 05/07/24 1905  WBC 7.3  NEUTROABS 5.8  HGB 16.4  HCT 48.3  MCV 80.6  PLT 184   Basic Metabolic Panel: Recent Labs  Lab 05/07/24 1905 05/08/24 0340  NA 135 136  K 2.9* 3.3*  CL 93* 99  CO2 22 27  GLUCOSE 128* 90  BUN 6 7  CREATININE 1.14 0.86  CALCIUM 9.0 8.2*  MG 1.6* 2.2  PHOS 1.7* 2.5   Liver Function Tests: Recent Labs  Lab 05/07/24 1905 05/08/24 0340  AST 72* 93*  ALT 18 21  ALKPHOS 124 107  BILITOT 1.3* 1.0  PROT 6.9 6.0*  ALBUMIN 4.0 3.7   CBG: Recent Labs  Lab 05/07/24 1905  GLUCAP 141*    Discharge time spent: greater than 30 minutes.  Signed: Sabas GORMAN Brod, MD [facility] 05/10/2024 "

## 2024-05-10 NOTE — Plan of Care (Signed)
" °  Problem: Education: Goal: Knowledge of General Education information will improve Description: Including pain rating scale, medication(s)/side effects and non-pharmacologic comfort measures 05/10/2024 1027 by Jenel Bobetta SAILOR, RN Outcome: Adequate for Discharge 05/10/2024 1022 by Jenel Bobetta SAILOR, RN Outcome: Progressing   Problem: Health Behavior/Discharge Planning: Goal: Ability to manage health-related needs will improve 05/10/2024 1027 by Jenel Bobetta SAILOR, RN Outcome: Adequate for Discharge 05/10/2024 1022 by Jenel Bobetta SAILOR, RN Outcome: Progressing   Problem: Clinical Measurements: Goal: Ability to maintain clinical measurements within normal limits will improve 05/10/2024 1027 by Jenel Bobetta SAILOR, RN Outcome: Adequate for Discharge 05/10/2024 1022 by Jenel Bobetta SAILOR, RN Outcome: Progressing Goal: Will remain free from infection 05/10/2024 1027 by Jenel Bobetta SAILOR, RN Outcome: Adequate for Discharge 05/10/2024 1022 by Jenel Bobetta SAILOR, RN Outcome: Progressing Goal: Diagnostic test results will improve 05/10/2024 1027 by Jenel Bobetta SAILOR, RN Outcome: Adequate for Discharge 05/10/2024 1022 by Jenel Bobetta SAILOR, RN Outcome: Progressing Goal: Respiratory complications will improve 05/10/2024 1027 by Jenel Bobetta SAILOR, RN Outcome: Adequate for Discharge 05/10/2024 1022 by Jenel Bobetta SAILOR, RN Outcome: Progressing Goal: Cardiovascular complication will be avoided 05/10/2024 1027 by Jenel Bobetta SAILOR, RN Outcome: Adequate for Discharge 05/10/2024 1022 by Jenel Bobetta SAILOR, RN Outcome: Progressing   Problem: Activity: Goal: Risk for activity intolerance will decrease 05/10/2024 1027 by Jenel Bobetta SAILOR, RN Outcome: Adequate for Discharge 05/10/2024 1022 by Jenel Bobetta SAILOR, RN Outcome: Progressing   Problem: Nutrition: Goal: Adequate nutrition will be maintained 05/10/2024 1027 by Jenel Bobetta SAILOR, RN Outcome: Adequate for Discharge 05/10/2024 1022 by Jenel Bobetta SAILOR, RN Outcome:  Progressing   Problem: Coping: Goal: Level of anxiety will decrease 05/10/2024 1027 by Jenel Bobetta SAILOR, RN Outcome: Adequate for Discharge 05/10/2024 1022 by Jenel Bobetta SAILOR, RN Outcome: Progressing   Problem: Elimination: Goal: Will not experience complications related to bowel motility 05/10/2024 1027 by Jenel Bobetta SAILOR, RN Outcome: Adequate for Discharge 05/10/2024 1022 by Jenel Bobetta SAILOR, RN Outcome: Progressing Goal: Will not experience complications related to urinary retention 05/10/2024 1027 by Jenel Bobetta SAILOR, RN Outcome: Adequate for Discharge 05/10/2024 1022 by Jenel Bobetta SAILOR, RN Outcome: Progressing   Problem: Pain Managment: Goal: General experience of comfort will improve and/or be controlled 05/10/2024 1027 by Jenel Bobetta SAILOR, RN Outcome: Adequate for Discharge 05/10/2024 1022 by Jenel Bobetta SAILOR, RN Outcome: Progressing   Problem: Safety: Goal: Ability to remain free from injury will improve 05/10/2024 1027 by Jenel Bobetta SAILOR, RN Outcome: Adequate for Discharge 05/10/2024 1022 by Jenel Bobetta SAILOR, RN Outcome: Progressing   Problem: Skin Integrity: Goal: Risk for impaired skin integrity will decrease 05/10/2024 1027 by Jenel Bobetta SAILOR, RN Outcome: Adequate for Discharge 05/10/2024 1022 by Jenel Bobetta SAILOR, RN Outcome: Progressing   "

## 2024-05-10 NOTE — Evaluation (Signed)
 Physical Therapy Evaluation Patient Details Name: Juan [name] MRN: [id number] DOB: 01/19/1966 Today's Date: 05/10/2024  History of Present Illness  57 y.o. male presented to the ED via EMS due to concern for seizure.  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 Past medical history significant of hypertension, alcohol use disorder, chronic back pain, opioid use disorder, depression, anxiety, psoriasis, childhood seizures, prior TBI.  Clinical Impression  Pt presents with admitting diagnosis above. Pt today was able to ambulate in hallway and navigate stairs with no AD CGA. Pt noted to ambulate with an antalgic gait pattern and fairly frequent knee buckling which pt reports as chronic over the last 2 years due to arthritis. Recommend Outpatient PT upon DC. PT will continue to follow.         If plan is discharge home, recommend the following: A little help with walking and/or transfers;A little help with bathing/dressing/bathroom;Assist for transportation;Help with stairs or ramp for entrance   Can travel by private vehicle        Equipment Recommendations None recommended by PT  Recommendations for Other Services       Functional Status Assessment Patient has had a recent decline in their functional status and demonstrates the ability to make significant improvements in function in a reasonable and predictable amount of time.     Precautions / Restrictions Precautions Precautions: Fall;Other (comment) (seizure) Recall of Precautions/Restrictions: Intact Restrictions Weight Bearing Restrictions Per Provider Order: No      Mobility  Bed Mobility Overal bed mobility: Independent                  Transfers Overall transfer level: Independent Equipment used: None                    Ambulation/Gait Ambulation/Gait assistance: Contact guard assist Gait Distance (Feet): 200 Feet Assistive device:  None Gait Pattern/deviations: Knees buckling, Antalgic, Decreased stride length, Step-through pattern Gait velocity: decreased     General Gait Details: Pt able to ambulate in hallway with no AD and mostly antalgic gait pattern. Pt also noted with  fairly frequent knee buckling which patient is able to self correct and reports that this issue has been chronic for over 2 years.  Stairs Stairs: Yes Stairs assistance: Contact guard assist Stair Management: One rail Left, One rail Right, Alternating pattern, Forwards Number of Stairs: 2 General stair comments: Cues for sequencing  Wheelchair Mobility     Tilt Bed    Modified Rankin (Stroke Patients Only)       Balance Overall balance assessment: Mild deficits observed, not formally tested                                           Pertinent Vitals/Pain Pain Assessment Pain Assessment: 0-10 Pain Score: 6  Pain Location: R knee Pain Descriptors / Indicators: Aching, Nagging Pain Intervention(s): Monitored during session    Home Living Family/patient expects to be discharged to:: Private residence Living Arrangements: Alone Available Help at Discharge: Family;Available PRN/intermittently Type of Home: House Home Access: Stairs to enter Entrance Stairs-Rails: Can reach both Entrance Stairs-Number of Steps: 3   Home Layout: One level Home Equipment: Grab bars - toilet;Grab bars - tub/shower;Hand held shower head;Rolling Walker (2 wheels);Cane - single point;Lift chair      Prior Function Prior Level of  Function : Independent/Modified Independent;Working/employed;Driving             Mobility Comments: Ind ADLs Comments: Ind     Extremity/Trunk Assessment   Upper Extremity Assessment Upper Extremity Assessment: Overall WFL for tasks assessed    Lower Extremity Assessment Lower Extremity Assessment: RLE deficits/detail RLE Deficits / Details: R knee buckling when ambulating    Cervical /  Trunk Assessment Cervical / Trunk Assessment: Normal  Communication   Communication Communication: No apparent difficulties    Cognition Arousal: Alert Behavior During Therapy: WFL for tasks assessed/performed   PT - Cognitive impairments: No apparent impairments                         Following commands: Intact       Cueing Cueing Techniques: Verbal cues, Tactile cues     General Comments General comments (skin integrity, edema, etc.): VSS    Exercises     Assessment/Plan    PT Assessment Patient needs continued PT services  PT Problem List Decreased strength;Decreased range of motion;Decreased balance;Decreased activity tolerance;Decreased mobility;Decreased coordination;Decreased cognition;Decreased safety awareness;Decreased knowledge of use of DME;Decreased knowledge of precautions;Cardiopulmonary status limiting activity       PT Treatment Interventions DME instruction;Gait training;Stair training;Functional mobility training;Therapeutic activities;Therapeutic exercise;Balance training;Neuromuscular re-education;Cognitive remediation;Patient/family education    PT Goals (Current goals can be found in the Care Plan section)  Acute Rehab PT Goals Patient Stated Goal: to go home PT Goal Formulation: With patient Time For Goal Achievement: 05/24/24 Potential to Achieve Goals: Good    Frequency Min 2X/week     Co-evaluation               AM-PAC PT 6 Clicks Mobility  Outcome Measure Help needed turning from your back to your side while in a flat bed without using bedrails?: None Help needed moving from lying on your back to sitting on the side of a flat bed without using bedrails?: None Help needed moving to and from a bed to a chair (including a wheelchair)?: None Help needed standing up from a chair using your arms (e.g., wheelchair or bedside chair)?: None Help needed to walk in hospital room?: A Little Help needed climbing 3-5 steps with a  railing? : A Little 6 Click Score: 22    End of Session Equipment Utilized During Treatment: Gait belt Activity Tolerance: Patient tolerated treatment well Patient left: in bed;with call bell/phone within reach;with bed alarm set;with family/visitor present Nurse Communication: Mobility status PT Visit Diagnosis: Other abnormalities of gait and mobility (R26.89)    Time: 9068-9051 PT Time Calculation (min) (ACUTE ONLY): 17 min   Charges:   PT Evaluation $PT Eval Moderate Complexity: 1 Mod   PT General Charges $$ ACUTE PT VISIT: 1 Visit         Sueellen NOVAK, PT, DPT Acute Rehab Services [phone]   Takesha Steger 05/10/2024, 10:04 AM

## 2024-05-10 NOTE — TOC Transition Note (Signed)
 Transition of Care [facility]) - Discharge Note   Patient Details  Name: Juan [name] MRN: [id number] Date of Birth: 1965-08-02  [facility] CM/SW Contact:  Andrez JULIANNA George, RN Phone Number: 05/10/2024, 10:38 AM   Clinical Narrative:     Pt is discharging home with outpatient therapy through [facility]. Information on the AVS. Pt will call to schedule the first appointment. Pt states his sister will provide needed transportation.  Final next level of care: OP Rehab Barriers to Discharge: No Barriers Identified   Patient Goals and CMS Choice            Discharge Placement                       Discharge Plan and Services Additional resources added to the After Visit Summary for     Discharge Planning Services: CM Consult                                 Social Drivers of Health (SDOH) Interventions SDOH Screenings   Food Insecurity: Food Insecurity Present (05/08/2024)  Housing: High Risk (05/08/2024)  Transportation Needs: No Transportation Needs (05/08/2024)  Utilities: Not At Risk (05/08/2024)  Tobacco Use: Medium Risk (05/08/2024)     Readmission Risk Interventions     No data to display

## 2024-06-23 ENCOUNTER — Ambulatory Visit: Admitting: Dermatology

## 2024-09-20 ENCOUNTER — Ambulatory Visit: Admitting: Dermatology
